# Patient Record
Sex: Female | Born: 1970 | Race: White | Hispanic: No | Marital: Married | Smoking: Never smoker
Health system: Southern US, Community
[De-identification: ages and names within clinical notes are randomized; demographics above are authoritative.]

## PROBLEM LIST (undated history)

## (undated) DIAGNOSIS — F419 Anxiety disorder, unspecified: Secondary | ICD-10-CM

## (undated) DIAGNOSIS — T8859XA Other complications of anesthesia, initial encounter: Secondary | ICD-10-CM

## (undated) DIAGNOSIS — I4891 Unspecified atrial fibrillation: Secondary | ICD-10-CM

## (undated) DIAGNOSIS — O4400 Placenta previa specified as without hemorrhage, unspecified trimester: Secondary | ICD-10-CM

## (undated) DIAGNOSIS — G4700 Insomnia, unspecified: Secondary | ICD-10-CM

## (undated) DIAGNOSIS — R519 Headache, unspecified: Secondary | ICD-10-CM

## (undated) DIAGNOSIS — F429 Obsessive-compulsive disorder, unspecified: Secondary | ICD-10-CM

## (undated) DIAGNOSIS — I471 Supraventricular tachycardia, unspecified: Secondary | ICD-10-CM

## (undated) DIAGNOSIS — K5909 Other constipation: Secondary | ICD-10-CM

## (undated) DIAGNOSIS — C801 Malignant (primary) neoplasm, unspecified: Secondary | ICD-10-CM

## (undated) DIAGNOSIS — E119 Type 2 diabetes mellitus without complications: Secondary | ICD-10-CM

## (undated) DIAGNOSIS — F32A Depression, unspecified: Secondary | ICD-10-CM

## (undated) DIAGNOSIS — F329 Major depressive disorder, single episode, unspecified: Secondary | ICD-10-CM

## (undated) HISTORY — PX: DIAGNOSTIC LAPAROSCOPY: SUR761

## (undated) HISTORY — DX: Unspecified atrial fibrillation: I48.91

## (undated) HISTORY — DX: Depression, unspecified: F32.A

## (undated) HISTORY — DX: Anxiety disorder, unspecified: F41.9

## (undated) HISTORY — PX: OTHER SURGICAL HISTORY: SHX169

## (undated) HISTORY — PX: APPENDECTOMY: SHX54

## (undated) HISTORY — PX: WISDOM TOOTH EXTRACTION: SHX21

## (undated) HISTORY — DX: Supraventricular tachycardia, unspecified: I47.10

## (undated) HISTORY — DX: Supraventricular tachycardia: I47.1

## (undated) HISTORY — DX: Major depressive disorder, single episode, unspecified: F32.9

---

## 2007-05-28 ENCOUNTER — Inpatient Hospital Stay (HOSPITAL_COMMUNITY): Admission: AD | Admit: 2007-05-28 | Discharge: 2007-05-29 | Payer: Self-pay | Admitting: Obstetrics & Gynecology

## 2007-06-15 ENCOUNTER — Inpatient Hospital Stay (HOSPITAL_COMMUNITY): Admission: AD | Admit: 2007-06-15 | Discharge: 2007-06-18 | Payer: Self-pay | Admitting: Obstetrics and Gynecology

## 2007-06-20 ENCOUNTER — Encounter: Admission: RE | Admit: 2007-06-20 | Discharge: 2007-07-20 | Payer: Self-pay | Admitting: *Deleted

## 2007-07-21 ENCOUNTER — Encounter: Admission: RE | Admit: 2007-07-21 | Discharge: 2007-08-19 | Payer: Self-pay | Admitting: *Deleted

## 2007-08-20 ENCOUNTER — Encounter: Admission: RE | Admit: 2007-08-20 | Discharge: 2007-09-11 | Payer: Self-pay | Admitting: *Deleted

## 2008-01-30 ENCOUNTER — Ambulatory Visit (HOSPITAL_COMMUNITY): Admission: RE | Admit: 2008-01-30 | Discharge: 2008-01-30 | Payer: Self-pay | Admitting: Internal Medicine

## 2009-03-01 ENCOUNTER — Encounter: Admission: RE | Admit: 2009-03-01 | Discharge: 2009-03-01 | Payer: Self-pay | Admitting: Diagnostic Radiology

## 2010-01-10 ENCOUNTER — Inpatient Hospital Stay (HOSPITAL_COMMUNITY): Admission: AD | Admit: 2010-01-10 | Discharge: 2010-01-10 | Payer: Self-pay | Admitting: Obstetrics and Gynecology

## 2010-01-27 ENCOUNTER — Observation Stay (HOSPITAL_COMMUNITY): Admission: AD | Admit: 2010-01-27 | Discharge: 2010-01-28 | Payer: Self-pay | Admitting: Obstetrics & Gynecology

## 2010-02-06 ENCOUNTER — Inpatient Hospital Stay (HOSPITAL_COMMUNITY): Admission: AD | Admit: 2010-02-06 | Discharge: 2010-02-08 | Payer: Self-pay | Admitting: Obstetrics and Gynecology

## 2010-11-09 LAB — CBC
MCHC: 33.5 g/dL (ref 30.0–36.0)
Platelets: 205 10*3/uL (ref 150–400)
RBC: 3.75 MIL/uL — ABNORMAL LOW (ref 3.87–5.11)
RDW: 14.3 % (ref 11.5–15.5)
WBC: 18 10*3/uL — ABNORMAL HIGH (ref 4.0–10.5)

## 2010-11-09 LAB — RPR: RPR Ser Ql: NONREACTIVE

## 2010-11-10 LAB — CBC
HCT: 31 % — ABNORMAL LOW (ref 36.0–46.0)
Hemoglobin: 10.3 g/dL — ABNORMAL LOW (ref 12.0–15.0)
WBC: 14.8 10*3/uL — ABNORMAL HIGH (ref 4.0–10.5)

## 2011-01-06 NOTE — H&P (Signed)
[name], [name]                   ACCOUNT NO.:  [id number]   MEDICAL RECORD NO.:  [id number]          PATIENT TYPE:  INP   LOCATION:  9106                          FACILITY:  [facility]   PHYSICIAN:  Lenoard Aden, M.D.DATE OF BIRTH:  27-Apr-1971   DATE OF ADMISSION:  06/15/2007  DATE OF DISCHARGE:                              HISTORY & PHYSICAL   CHIEF COMPLAINT:  Contractions.   She is a 40 year old white female, G3, P2, currently at 38-6/7th's  weeks' gestation with increased frequency of contractions this evening.  She has a history of a vaginal delivery of a 7 plus pound fetus  complicated by a 3rd degree laceration, followed by a primary C-section  for placenta previa.  She desires VBAC.  VBAC approved per notes by Dr.  Earlene Plater who has reviewed her previous C-section note.   MEDICATIONS:  Prenatal vitamins, Ambien as needed, Prozac.   FAMILY HISTORY:  Remarkable for rheumatoid arthritis, COPD, and alcohol  abuse.   She is a nonsmoker, nondrinker.  She denies domestic or physical  violence.   She has history as noted of first delivery in 2003, of a 7 pounds 11  ounces child and then a primary C-section for a 6 pounds 6 ounces  elective C-section at 37 weeks for a complete previa.   PRENATAL COURSE:  Records a placenta that is anterior and no previa is  noted.   PHYSICAL EXAMINATION:  GENERAL:  She is a well-developed, well-  nourished, white female in a moderate amount of distress.  HEENT:  Normal.  LUNGS:  Clear.  HEART:  Regular rhythm.  ABDOMEN:  Soft, gravid, nontender.  Estimated fetal weight on ultrasound  today is 6 pounds 11 ounces.  PELVIC:  Cervix is 3-to-4-cm, 80-90% effaced, vertex, zero station.  Artificial rupture of membranes clear.  IUPC placed.  EXTREMITIES:  Reveal no cords.  NEUROLOGIC:  Nonfocal.  SKIN:  Intact.   IMPRESSION:  1. A 38-6/7th's weeks intrauterine pregnancy.  2. Previous cesarean section with a history of successful vaginal  delivery for a trial of labor and vaginal birth after cesarean.   PLAN:  1. Attempt cautious attempts at vaginal delivery.  2. IUPC placed.  3. Pitocin augmentation as needed.  4. Small risk of uterine dehiscence and possible uterus rupture      approaching approximately 1-3% noted, the patient acknowledges and      wishes to proceed.      Lenoard Aden, M.D.  Electronically Signed     RJT/MEDQ  D:  06/15/2007  T:  06/16/2007  Job:  [id number]

## 2011-06-03 LAB — CBC
HCT: 29.4 — ABNORMAL LOW
Hemoglobin: 11.9 — ABNORMAL LOW
MCHC: 33.8
MCV: 87.4
MCV: 87.6
Platelets: 230
RBC: 4.03
RDW: 14.1 — ABNORMAL HIGH

## 2011-06-04 LAB — RUBELLA SCREEN: Rubella: 19.4 — ABNORMAL HIGH

## 2011-06-04 LAB — CBC
HCT: 33.1 — ABNORMAL LOW
Platelets: 241
RDW: 13.3

## 2011-08-25 HISTORY — PX: ABLATION: SHX5711

## 2011-10-15 ENCOUNTER — Encounter (HOSPITAL_COMMUNITY): Payer: Self-pay | Admitting: Pharmacist

## 2011-10-20 ENCOUNTER — Encounter (HOSPITAL_COMMUNITY)
Admission: RE | Admit: 2011-10-20 | Discharge: 2011-10-20 | Disposition: A | Discharge status: Home / Self Care | Payer: PRIVATE HEALTH INSURANCE | Source: Ambulatory Visit | Attending: Obstetrics and Gynecology | Admitting: Obstetrics and Gynecology

## 2011-10-20 ENCOUNTER — Encounter (HOSPITAL_COMMUNITY): Payer: Self-pay

## 2011-10-20 ENCOUNTER — Other Ambulatory Visit: Payer: Self-pay | Admitting: Obstetrics and Gynecology

## 2011-10-20 DIAGNOSIS — N8000 Endometriosis of the uterus, unspecified: Secondary | ICD-10-CM | POA: Insufficient documentation

## 2011-10-20 DIAGNOSIS — Z01812 Encounter for preprocedural laboratory examination: Secondary | ICD-10-CM | POA: Insufficient documentation

## 2011-10-20 HISTORY — DX: Other complications of anesthesia, initial encounter: T88.59XA

## 2011-10-20 HISTORY — DX: Obsessive-compulsive disorder, unspecified: F42.9

## 2011-10-20 HISTORY — DX: Insomnia, unspecified: G47.00

## 2011-10-20 LAB — SURGICAL PCR SCREEN: Staphylococcus aureus: POSITIVE — AB

## 2011-10-20 LAB — CBC
Hemoglobin: 12.8 g/dL (ref 12.0–15.0)
MCHC: 31.8 g/dL (ref 30.0–36.0)
RDW: 13.5 % (ref 11.5–15.5)
WBC: 6.8 10*3/uL (ref 4.0–10.5)

## 2011-10-20 NOTE — Patient Instructions (Addendum)
10/30/1318 [name]  10/20/2011   Your procedure is scheduled on:  10/30/11  Enter through the Main Entrance of [facility] at 945 AM.  Pick up the phone at the desk and dial [phone].   Call this number if you have problems the morning of surgery: [phone]   Remember:   Do not eat food:After Midnight.  Do not drink clear liquids: After Midnight.  Take these medicines the morning of surgery with A SIP OF WATER: NA   Do not wear jewelry, make-up or nail polish.  Do not wear lotions, powders, or perfumes. You may wear deodorant.  Do not shave 48 hours prior to surgery.  Do not bring valuables to the hospital.  Contacts, dentures or bridgework may not be worn into surgery.  Leave suitcase in the car. After surgery it may be brought to your room.  For patients admitted to the hospital, checkout time is 11:00 AM the day of discharge.   Patients discharged the day of surgery will not be allowed to drive home.  Name and phone number of your driver: NA  Special Instructions: CHG Shower Use Special Wash: 1/2 bottle night before surgery and 1/2 bottle morning of surgery.   Please read over the following fact sheets that you were given: MRSA Information

## 2011-10-29 NOTE — H&P (Signed)
[name], [name]                   ACCOUNT NO.:  [id number]  MEDICAL RECORD NO.:  [id number]  LOCATION:                                 FACILITY:  PHYSICIAN:  [name], M.D.DATE OF BIRTH:  July 04, 1971  DATE OF ADMISSION: DATE OF DISCHARGE:                             HISTORY & PHYSICAL   CHIEF COMPLAINT:  Dysmenorrhea and menorrhagia.  HISTORY OF PRESENT ILLNESS:  She is a 41 year old white female, G3, P3, husband status post vasectomy, with a history of C-section x1 and vaginal delivery x2, who presents now with worsening dysmenorrhea and menorrhagia.  The patient has a known history of a reportedly severe endometriosis status post laparoscopic resection in 2002.  She has a history of appendectomy in 1995.  She has medications to include Luvox.  She has medical problems to include OCP, dysmenorrhea, depression, migraine headaches.  She has a family history of rheumatoid arthritis, nicotine dependence, alcohol abuse, and COPD.  She has a history of vaginal delivery x2 and C-section x1 for placenta previa in addition to her previously noted surgical interventions.  PHYSICAL EXAMINATION:  GENERAL:  She is a well-developed, well- nourished, white female, in no acute distress. VITAL SIGNS:  Height is 65 inches, weight of 132 pounds. HEENT:  Normal. NECK:  Supple.  Full range of motion. LUNGS:  Clear. HEART:  Regular rhythm. ABDOMEN:  Soft, nontender. PELVIC:  Tenderness in the lower quadrants with an anteflexed uterus and no adnexal masses.  IMPRESSION: 1. Severe dysmenorrhea and menorrhagia. 2. History of pelvic endometriosis.  PLAN:  Proceed with diagnostic hysteroscopy, D and C, NovaSure endometrial ablation, diagnostic laparoscopy, possible robotic excision of endometriosis with possible lysis of adhesions. Risks of anesthesia and risks of surgery were discussed to include risks of anesthesia, infection, bleeding, injury to abdominal organs, need  for repair, delayed versus immediate complications to include bowel and bladder injury noted, possible inability to cure endometriosis were discussed, the patient acknowledges and we will proceed.     [name], M.D.     [name]/MEDQ  D:  10/29/2011  T:  10/29/2011  Job:  [id number]

## 2011-10-30 ENCOUNTER — Ambulatory Visit (HOSPITAL_COMMUNITY)
Admission: RE | Admit: 2011-10-30 | Discharge: 2011-10-30 | Disposition: A | Discharge status: Home Health | Payer: PRIVATE HEALTH INSURANCE | Source: Ambulatory Visit | Attending: Obstetrics and Gynecology | Admitting: Obstetrics and Gynecology

## 2011-10-30 ENCOUNTER — Encounter (HOSPITAL_COMMUNITY): Payer: Self-pay | Admitting: Anesthesiology

## 2011-10-30 ENCOUNTER — Encounter (HOSPITAL_COMMUNITY): Payer: Self-pay | Admitting: *Deleted

## 2011-10-30 ENCOUNTER — Encounter (HOSPITAL_COMMUNITY)
Admission: RE | Disposition: A | Discharge status: Home Health | Payer: Self-pay | Source: Ambulatory Visit | Attending: Obstetrics and Gynecology

## 2011-10-30 ENCOUNTER — Ambulatory Visit (HOSPITAL_COMMUNITY): Payer: PRIVATE HEALTH INSURANCE | Admitting: Anesthesiology

## 2011-10-30 DIAGNOSIS — Z01818 Encounter for other preprocedural examination: Secondary | ICD-10-CM | POA: Insufficient documentation

## 2011-10-30 DIAGNOSIS — N920 Excessive and frequent menstruation with regular cycle: Secondary | ICD-10-CM | POA: Insufficient documentation

## 2011-10-30 DIAGNOSIS — N946 Dysmenorrhea, unspecified: Secondary | ICD-10-CM

## 2011-10-30 DIAGNOSIS — N949 Unspecified condition associated with female genital organs and menstrual cycle: Secondary | ICD-10-CM | POA: Insufficient documentation

## 2011-10-30 DIAGNOSIS — Z01812 Encounter for preprocedural laboratory examination: Secondary | ICD-10-CM | POA: Insufficient documentation

## 2011-10-30 HISTORY — PX: LAPAROSCOPY: SHX197

## 2011-10-30 LAB — HCG, SERUM, QUALITATIVE: Preg, Serum: NEGATIVE

## 2011-10-30 SURGERY — LAPAROSCOPY, DIAGNOSTIC
Anesthesia: General | Wound class: Clean Contaminated

## 2011-10-30 MED ORDER — ROCURONIUM BROMIDE 100 MG/10ML IV SOLN
INTRAVENOUS | Status: DC | PRN
  Administered 2011-10-30: 50 mg via INTRAVENOUS

## 2011-10-30 MED ORDER — ONDANSETRON HCL 4 MG/2ML IJ SOLN
INTRAMUSCULAR | Status: DC | PRN
  Administered 2011-10-30: 4 mg via INTRAVENOUS

## 2011-10-30 MED ORDER — LIDOCAINE HCL (CARDIAC) 20 MG/ML IV SOLN
INTRAVENOUS | Status: DC | PRN
Start: 2011-10-30 — End: 2011-10-30
  Administered 2011-10-30: 50 mg via INTRAVENOUS

## 2011-10-30 MED ORDER — GLYCOPYRROLATE 0.2 MG/ML IJ SOLN
INTRAMUSCULAR | Status: AC
  Filled 2011-10-30: qty 2

## 2011-10-30 MED ORDER — MEPERIDINE HCL 25 MG/ML IJ SOLN
INTRAMUSCULAR | Status: AC
  Administered 2011-10-30: 25 mg via INTRAVENOUS
  Filled 2011-10-30: qty 1

## 2011-10-30 MED ORDER — MEPERIDINE HCL 25 MG/ML IJ SOLN
25.0000 mg | Freq: Once | INTRAMUSCULAR | Status: AC
  Administered 2011-10-30: 25 mg via INTRAVENOUS

## 2011-10-30 MED ORDER — MIDAZOLAM HCL 2 MG/2ML IJ SOLN
INTRAMUSCULAR | Status: AC
  Administered 2011-10-30: 1 mg via INTRAVENOUS
  Filled 2011-10-30: qty 2

## 2011-10-30 MED ORDER — NEOSTIGMINE METHYLSULFATE 1 MG/ML IJ SOLN
INTRAMUSCULAR | Status: AC
  Filled 2011-10-30: qty 10

## 2011-10-30 MED ORDER — LACTATED RINGERS IR SOLN
Status: DC | PRN
  Administered 2011-10-30: 3000 mL

## 2011-10-30 MED ORDER — CEFAZOLIN SODIUM 1-5 GM-% IV SOLN
INTRAVENOUS | Status: AC
  Filled 2011-10-30: qty 50

## 2011-10-30 MED ORDER — LORAZEPAM 2 MG/ML IJ SOLN
INTRAMUSCULAR | Status: AC
  Administered 2011-10-30: 1 mg
  Filled 2011-10-30: qty 1

## 2011-10-30 MED ORDER — MIDAZOLAM HCL 2 MG/2ML IJ SOLN
INTRAMUSCULAR | Status: AC
  Filled 2011-10-30: qty 2

## 2011-10-30 MED ORDER — ROCURONIUM BROMIDE 50 MG/5ML IV SOLN
INTRAVENOUS | Status: AC
  Filled 2011-10-30: qty 1

## 2011-10-30 MED ORDER — EPHEDRINE 5 MG/ML INJ
INTRAVENOUS | Status: AC
  Filled 2011-10-30: qty 10

## 2011-10-30 MED ORDER — ACETAMINOPHEN 325 MG PO TABS: 325.0000 mg | ORAL_TABLET | ORAL | Status: DC | PRN

## 2011-10-30 MED ORDER — VASOPRESSIN 20 UNIT/ML IJ SOLN
INTRAMUSCULAR | Status: AC
  Filled 2011-10-30: qty 1

## 2011-10-30 MED ORDER — SUFENTANIL CITRATE 50 MCG/ML IV SOLN
INTRAVENOUS | Status: DC | PRN
  Administered 2011-10-30 (×2): 20 ug via INTRAVENOUS
  Administered 2011-10-30: 10 ug via INTRAVENOUS

## 2011-10-30 MED ORDER — GLYCOPYRROLATE 0.2 MG/ML IJ SOLN
INTRAMUSCULAR | Status: DC | PRN
  Administered 2011-10-30: 0.4 mg via INTRAVENOUS

## 2011-10-30 MED ORDER — LIDOCAINE HCL (CARDIAC) 20 MG/ML IV SOLN
INTRAVENOUS | Status: AC
  Filled 2011-10-30: qty 5

## 2011-10-30 MED ORDER — ARTIFICIAL TEARS OP OINT
TOPICAL_OINTMENT | OPHTHALMIC | Status: DC | PRN
  Administered 2011-10-30: 1 via OPHTHALMIC

## 2011-10-30 MED ORDER — BUPIVACAINE HCL (PF) 0.25 % IJ SOLN
INTRAMUSCULAR | Status: AC
  Filled 2011-10-30: qty 30

## 2011-10-30 MED ORDER — ONDANSETRON HCL 4 MG/2ML IJ SOLN
INTRAMUSCULAR | Status: AC
  Filled 2011-10-30: qty 2

## 2011-10-30 MED ORDER — SUFENTANIL CITRATE 50 MCG/ML IV SOLN
INTRAVENOUS | Status: AC
  Filled 2011-10-30: qty 1

## 2011-10-30 MED ORDER — OXYCODONE-ACETAMINOPHEN 5-325 MG PO TABS: 1.0000 | ORAL_TABLET | ORAL | Status: AC | PRN

## 2011-10-30 MED ORDER — KETOROLAC TROMETHAMINE 30 MG/ML IJ SOLN
30.0000 mg | Freq: Once | INTRAMUSCULAR | Status: AC
  Administered 2011-10-30: 30 mg via INTRAVENOUS

## 2011-10-30 MED ORDER — KETOROLAC TROMETHAMINE 30 MG/ML IJ SOLN
INTRAMUSCULAR | Status: AC
  Filled 2011-10-30: qty 1

## 2011-10-30 MED ORDER — BUPIVACAINE HCL (PF) 0.25 % IJ SOLN
INTRAMUSCULAR | Status: DC | PRN
  Administered 2011-10-30: 30 mL

## 2011-10-30 MED ORDER — OXYCODONE-ACETAMINOPHEN 5-325 MG PO TABS
1.0000 | ORAL_TABLET | ORAL | Status: DC | PRN
  Administered 2011-10-30: 1 via ORAL

## 2011-10-30 MED ORDER — CEFAZOLIN SODIUM 1-5 GM-% IV SOLN
INTRAVENOUS | Status: DC | PRN
  Administered 2011-10-30: 1 g via INTRAVENOUS

## 2011-10-30 MED ORDER — KETOROLAC TROMETHAMINE 30 MG/ML IJ SOLN
INTRAMUSCULAR | Status: AC
  Administered 2011-10-30: 30 mg via INTRAVENOUS
  Filled 2011-10-30: qty 1

## 2011-10-30 MED ORDER — MIDAZOLAM HCL 5 MG/5ML IJ SOLN
INTRAMUSCULAR | Status: DC | PRN
  Administered 2011-10-30: 1 mg via INTRAVENOUS
  Administered 2011-10-30: 2 mg via INTRAVENOUS

## 2011-10-30 MED ORDER — EPHEDRINE SULFATE 50 MG/ML IJ SOLN
INTRAMUSCULAR | Status: DC | PRN
  Administered 2011-10-30 (×3): 5 mg via INTRAVENOUS

## 2011-10-30 MED ORDER — DEXAMETHASONE SODIUM PHOSPHATE 10 MG/ML IJ SOLN
INTRAMUSCULAR | Status: DC | PRN
  Administered 2011-10-30: 10 mg via INTRAVENOUS

## 2011-10-30 MED ORDER — DIPHENHYDRAMINE HCL 50 MG/ML IJ SOLN
INTRAMUSCULAR | Status: AC
  Administered 2011-10-30 (×2): 12.5 mg via INTRAVENOUS
  Filled 2011-10-30: qty 1

## 2011-10-30 MED ORDER — MIDAZOLAM HCL 2 MG/2ML IJ SOLN
1.0000 mg | INTRAMUSCULAR | Status: DC | PRN
  Administered 2011-10-30 (×2): 1 mg via INTRAVENOUS

## 2011-10-30 MED ORDER — LACTATED RINGERS IV SOLN
INTRAVENOUS | Status: DC
  Administered 2011-10-30 (×3): via INTRAVENOUS

## 2011-10-30 MED ORDER — NEOSTIGMINE METHYLSULFATE 1 MG/ML IJ SOLN
INTRAMUSCULAR | Status: DC | PRN
  Administered 2011-10-30: 2 mg via INTRAVENOUS

## 2011-10-30 MED ORDER — OXYCODONE-ACETAMINOPHEN 5-325 MG PO TABS
ORAL_TABLET | ORAL | Status: AC
  Administered 2011-10-30: 1 via ORAL
  Filled 2011-10-30: qty 1

## 2011-10-30 MED ORDER — KETOROLAC TROMETHAMINE 30 MG/ML IJ SOLN: 15.0000 mg | Freq: Once | INTRAMUSCULAR | Status: DC | PRN

## 2011-10-30 MED ORDER — PROPOFOL 10 MG/ML IV EMUL
INTRAVENOUS | Status: AC
  Filled 2011-10-30: qty 40

## 2011-10-30 MED ORDER — KETOROLAC TROMETHAMINE 60 MG/2ML IM SOLN
INTRAMUSCULAR | Status: DC | PRN
  Administered 2011-10-30: 30 mg via INTRAMUSCULAR

## 2011-10-30 MED ORDER — PROPOFOL 10 MG/ML IV EMUL
INTRAVENOUS | Status: DC | PRN
  Administered 2011-10-30: 200 mg via INTRAVENOUS

## 2011-10-30 MED ORDER — FENTANYL CITRATE 0.05 MG/ML IJ SOLN: 25.0000 ug | INTRAMUSCULAR | Status: DC | PRN

## 2011-10-30 MED ORDER — DEXAMETHASONE SODIUM PHOSPHATE 10 MG/ML IJ SOLN
INTRAMUSCULAR | Status: AC
  Filled 2011-10-30: qty 1

## 2011-10-30 MED ORDER — MIDAZOLAM HCL 2 MG/2ML IJ SOLN
2.0000 mg | Freq: Once | INTRAMUSCULAR | Status: AC
  Administered 2011-10-30: 2 mg via INTRAVENOUS

## 2011-10-30 SURGICAL SUPPLY — 75 items
ABLATOR ENDOMETRIAL BIPOLAR (ABLATOR) ×6 IMPLANT
BAG URINE DRAINAGE (UROLOGICAL SUPPLIES) IMPLANT
BARRIER ADHS 3X4 INTERCEED (GAUZE/BANDAGES/DRESSINGS) IMPLANT
CABLE HIGH FREQUENCY MONO STRZ (ELECTRODE) ×3 IMPLANT
CATH FOLEY 3WAY  5CC 16FR (CATHETERS) ×1
CATH FOLEY 3WAY 5CC 16FR (CATHETERS) ×2 IMPLANT
CATH ROBINSON RED A/P 16FR (CATHETERS) ×3 IMPLANT
CHLORAPREP W/TINT 26ML (MISCELLANEOUS) ×3 IMPLANT
CLOTH BEACON ORANGE TIMEOUT ST (SAFETY) ×3 IMPLANT
CONT PATH 16OZ SNAP LID 3702 (MISCELLANEOUS) ×3 IMPLANT
CONTAINER PREFILL 10% NBF 60ML (FORM) ×6 IMPLANT
COVER MAYO STAND STRL (DRAPES) ×3 IMPLANT
COVER TABLE BACK 60X90 (DRAPES) ×6 IMPLANT
COVER TIP SHEARS 8 DVNC (MISCELLANEOUS) ×2 IMPLANT
COVER TIP SHEARS 8MM DA VINCI (MISCELLANEOUS) ×1
DECANTER SPIKE VIAL GLASS SM (MISCELLANEOUS) ×3 IMPLANT
DERMABOND ADVANCED (GAUZE/BANDAGES/DRESSINGS) ×1
DERMABOND ADVANCED .7 DNX12 (GAUZE/BANDAGES/DRESSINGS) ×2 IMPLANT
DRAPE HUG U DISPOSABLE (DRAPE) ×3 IMPLANT
DRAPE LG THREE QUARTER DISP (DRAPES) ×6 IMPLANT
DRAPE MONITOR DA VINCI (DRAPE) IMPLANT
DRAPE WARM FLUID 44X44 (DRAPE) ×3 IMPLANT
ELECT REM PT RETURN 9FT ADLT (ELECTROSURGICAL) ×3
ELECTRODE REM PT RTRN 9FT ADLT (ELECTROSURGICAL) ×2 IMPLANT
EVACUATOR SMOKE 8.L (FILTER) ×3 IMPLANT
GAUZE VASELINE 3X9 (GAUZE/BANDAGES/DRESSINGS) IMPLANT
GLOVE BIO SURGEON STRL SZ7.5 (GLOVE) ×9 IMPLANT
GOWN PREVENTION PLUS LG XLONG (DISPOSABLE) ×3 IMPLANT
GOWN PREVENTION PLUS XLARGE (GOWN DISPOSABLE) ×3 IMPLANT
GOWN STRL REIN XL XLG (GOWN DISPOSABLE) ×18 IMPLANT
GYRUS RUMI II 2.5CM BLUE (DISPOSABLE)
GYRUS RUMI II 3.5CM BLUE (DISPOSABLE)
GYRUS RUMI II 4.0CM BLUE (DISPOSABLE)
KIT ACCESSORY DA VINCI DISP (KITS) ×1
KIT ACCESSORY DVNC DISP (KITS) ×2 IMPLANT
KIT DISP ACCESSORY 4 ARM (KITS) IMPLANT
MANIPULATOR UTERINE 4.5 ZUMI (MISCELLANEOUS) ×3 IMPLANT
NEEDLE INSUFFLATION 14GA 120MM (NEEDLE) ×3 IMPLANT
PACK HYSTEROSCOPY LF (CUSTOM PROCEDURE TRAY) ×3 IMPLANT
PACK LAVH (CUSTOM PROCEDURE TRAY) ×3 IMPLANT
PAD PREP 24X48 CUFFED NSTRL (MISCELLANEOUS) ×6 IMPLANT
PLUG CATH AND CAP STER (CATHETERS) IMPLANT
POSITIONER SURGICAL ARM (MISCELLANEOUS) ×6 IMPLANT
PROTECTOR NERVE ULNAR (MISCELLANEOUS) ×6 IMPLANT
RUMI II 3.0CM BLUE KOH-EFFICIE (DISPOSABLE) IMPLANT
RUMI II GYRUS 2.5CM BLUE (DISPOSABLE) IMPLANT
RUMI II GYRUS 3.5CM BLUE (DISPOSABLE) IMPLANT
RUMI II GYRUS 4.0CM BLUE (DISPOSABLE) IMPLANT
SET IRRIG TUBING LAPAROSCOPIC (IRRIGATION / IRRIGATOR) ×3 IMPLANT
SOLUTION ELECTROLUBE (MISCELLANEOUS) ×3 IMPLANT
SPONGE LAP 18X18 X RAY DECT (DISPOSABLE) IMPLANT
SUT VIC AB 0 CT1 27 (SUTURE) ×2
SUT VIC AB 0 CT1 27XBRD ANBCTR (SUTURE) ×4 IMPLANT
SUT VIC AB 0 CT1 27XBRD ANTBC (SUTURE) IMPLANT
SUT VICRYL 0 UR6 27IN ABS (SUTURE) ×6 IMPLANT
SUT VICRYL RAPIDE 4/0 PS 2 (SUTURE) ×6 IMPLANT
SYR 50ML LL SCALE MARK (SYRINGE) ×3 IMPLANT
SYR TB 1ML 25GX5/8 (SYRINGE) ×3 IMPLANT
SYRINGE 10CC LL (SYRINGE) ×3 IMPLANT
SYSTEM CONVERTIBLE TROCAR (TROCAR) IMPLANT
TIP UTERINE 5.1X6CM LAV DISP (MISCELLANEOUS) IMPLANT
TIP UTERINE 6.7X10CM GRN DISP (MISCELLANEOUS) IMPLANT
TIP UTERINE 6.7X6CM WHT DISP (MISCELLANEOUS) IMPLANT
TIP UTERINE 6.7X8CM BLUE DISP (MISCELLANEOUS) IMPLANT
TOWEL OR 17X24 6PK STRL BLUE (TOWEL DISPOSABLE) ×9 IMPLANT
TRAY FOLEY CATH 16FR SILVER (SET/KITS/TRAYS/PACK) ×3 IMPLANT
TROCAR DISP BLADELESS 8 DVNC (TROCAR) ×2 IMPLANT
TROCAR DISP BLADELESS 8MM (TROCAR) ×1
TROCAR XCEL 12X100 BLDLESS (ENDOMECHANICALS) IMPLANT
TROCAR XCEL NON-BLD 5MMX100MML (ENDOMECHANICALS) ×3 IMPLANT
TROCAR Z-THREAD 12X150 (TROCAR) ×3 IMPLANT
TROCAR Z-THREAD FIOS 12X100MM (TROCAR) IMPLANT
TUBING FILTER THERMOFLATOR (ELECTROSURGICAL) ×3 IMPLANT
WARMER LAPAROSCOPE (MISCELLANEOUS) ×3 IMPLANT
WATER STERILE IRR 1000ML POUR (IV SOLUTION) ×9 IMPLANT

## 2011-10-30 NOTE — Progress Notes (Signed)
Patient ID: [name], female   DOB: June 01, 1971, 41 y.o.   MRN: [id number] Patient seen and examined. Consent witnessed and signed. No changes noted. Update completed.

## 2011-10-30 NOTE — Discharge Instructions (Signed)

## 2011-10-30 NOTE — Anesthesia Preprocedure Evaluation (Signed)
Anesthesia Evaluation  Patient identified by MRN, date of birth, ID band Patient awake    Reviewed: Allergy & Precautions, H&P , Patient's Chart, lab work & pertinent test results, reviewed documented beta blocker date and time   Airway Mallampati: II TM Distance: >3 FB Neck ROM: full    Dental No notable dental hx.    Pulmonary neg pulmonary ROS,  breath sounds clear to auscultation  Pulmonary exam normal       Cardiovascular Exercise Tolerance: Good negative cardio ROS  Rhythm:regular Rate:Normal     Neuro/Psych negative neurological ROS  negative psych ROS   GI/Hepatic negative GI ROS, Neg liver ROS,   Endo/Other  negative endocrine ROS  Renal/GU negative Renal ROS     Musculoskeletal   Abdominal   Peds  Hematology negative hematology ROS (+)   Anesthesia Other Findings   Reproductive/Obstetrics negative OB ROS                           Anesthesia Physical Anesthesia Plan  ASA: II  Anesthesia Plan: General ETT   Post-op Pain Management:    Induction:   Airway Management Planned:   Additional Equipment:   Intra-op Plan:   Post-operative Plan:   Informed Consent: I have reviewed the patients History and Physical, chart, labs and discussed the procedure including the risks, benefits and alternatives for the proposed anesthesia with the patient or authorized representative who has indicated his/her understanding and acceptance.   Dental Advisory Given  Plan Discussed with: CRNA and Surgeon  Anesthesia Plan Comments:         Anesthesia Quick Evaluation

## 2011-10-30 NOTE — Anesthesia Postprocedure Evaluation (Signed)
Anesthesia Post Note  Patient: [name]  Procedure(s) Performed: Procedure(s) (LRB): LAPAROSCOPY DIAGNOSTIC (N/A) DILATATION & CURETTAGE/HYSTEROSCOPY WITH NOVASURE ABLATION ()  Anesthesia type: GA  Patient location: PACU  Post pain: Pain level controlled  Post assessment: Post-op Vital signs reviewed  Last Vitals:  Filed Vitals:   10/30/11 1215  BP:   Pulse:   Temp: 36.6 C  Resp:     Post vital signs: Reviewed  Level of consciousness: sedated  Complications: No apparent anesthesia complications

## 2011-10-30 NOTE — Transfer of Care (Signed)
Immediate Anesthesia Transfer of Care Note  Patient: [name]  Procedure(s) Performed: Procedure(s) (LRB): LAPAROSCOPY DIAGNOSTIC (N/A) DILATATION & CURETTAGE/HYSTEROSCOPY WITH NOVASURE ABLATION ()  Patient Location: PACU  Anesthesia Type: General  Level of Consciousness: oriented and sedated  Airway & Oxygen Therapy: Patient Spontanous Breathing and Patient connected to nasal cannula oxygen  Post-op Assessment: Report given to PACU RN and Post -op Vital signs reviewed and stable  Post vital signs: stable  Complications: No apparent anesthesia complications

## 2011-10-30 NOTE — Progress Notes (Signed)
Pt received in recovery at 12:12. Pt  Heart rate 140's to 150' sinus tachycardia janet mullins at bedside(crna )calls dr Brayton Caves to report no further orders given pt assymptomatic.  12:15 pt co itching janet mullins crna gets order to give iv benedryl.  Pt gets relief from itching continues to have elevated heart rate. 12:40 pt starts shiverring orders given to give demerol iv for shiverring. shiverring starts to get better . shiverring starts again. Demerol given aagain at 12:51.  Pt heart rate continues to be elevated thorougout pacu stay at time of discharge heartrate 120's pt assymptomatic . Dr Cristela Blue gives orders to let pt get up to go home.  approx 15:15 pt unable to void states she feels urge to void. Call to dr Cristela Blue toin and out cath pt . In and out cath voided 700cc clear yellow urine , bladder scanned prior to cathing. Scanned for 370 cc. Pt states relief after cathing. Discharged to home. Pt heartrate 112-118 at time of discharge.

## 2011-10-30 NOTE — Op Note (Signed)
10/30/2011  11:56 AM  PATIENT:  [name]  41 y.o. female  PRE-OPERATIVE DIAGNOSIS:  History of Severe Endometriosis; Pelvic Pain; Menorrhagia  POST-OPERATIVE DIAGNOSIS:  History of Severe Endometriosis; Pelvic Pain; Menorrhagia  PROCEDURE:  Procedure(s): LAPAROSCOPY DIAGNOSTIC DILATATION & CURETTAGE/HYSTEROSCOPY WITH NOVASURE ABLATION  SURGEON:  Surgeon(s): Lenoard Aden, MD  ASSISTANTS: none   ANESTHESIA:   local and general  ESTIMATED BLOOD LOSS: * No blood loss amount entered *   DRAINS: none   LOCAL MEDICATIONS USED:  MARCAINE     SPECIMEN:  Source of Specimen:  [facility]  DISPOSITION OF SPECIMEN:  PATHOLOGY  COUNTS:  YES  DICTATION #: [id number]  PLAN OF CARE: DC home  PATIENT DISPOSITION:  PACU - hemodynamically stable.

## 2011-10-31 NOTE — Op Note (Signed)
NAME:  [name], [name]                   ACCOUNT NO.:  [id number]  MEDICAL RECORD NO.:  [id number]  LOCATION:  [facility]                          FACILITY:  [facility]  PHYSICIAN:  Lenoard Aden, M.D.DATE OF BIRTH:  03-30-1971  DATE OF PROCEDURE: DATE OF DISCHARGE:  10/30/2011                              OPERATIVE REPORT   DESCRIPTION OF PROCEDURE:  After being apprised of risks of anesthesia, infection, bleeding, injury to abdominal organs, possible need for repair, delayed versus immediate complications to include bowel and bladder injury, possible need for repair, inability to cure pelvic pain, the patient was brought to the operating room where she was administered general anesthetic without complications. She was prepped and draped in usual sterile fashion.  Foley catheter placed.  ZUMI retractor placed per vagina.  Attention was turned to the infraumbilical portion of the procedure whereby incision was made with a scalpel.  Veress needle placed, an opening pressure of -1 was noted, 4 L of CO2 insufflated without difficulty.  A trocar placed atraumatically.  Visualization reveals a normal uterus, normal anterior-posterior cul-de-sac, small amount of scarring anteriorly from previous C section.  Both ovarian fossas appear normal.  Both tubes appear normal.  Both ovaries appear normal.  Posterior cul-de-sac appears normal.  Appendix was visualized and appears to be normal as well.  No evidence of pelvic endometriosis is noted.  Therefore, the procedure was terminated.  The CO2 was released.  The trocar and scope were removed.  Atraumatic trocar entry was assured upon visualization initially in the procedure.  Upon closure after release of CO2, the incisions closed with 0 Vicryl, 4-0 Vicryl and Dermabond.  Attention was turned to the pelvic portion of the procedure whereby the cervix was easily dilated up to a 25 Pratt dilator. Hysteroscope placed.  Visualization revealed a normal  endometrial cavity.  Endometrial curettings were collected in sharp curettage and 4 quadrant method.  Revisualization revealed an otherwise normal cavity. The NovaSure device was seated in place to a length of 6, a width of 4.5, for a 149 wattage for 63 seconds.  Please note that the CO2 test was performed and was negative.  At the end of the NovaSure procedure, the device was removed, inspected, and found to be intact. Revisualization of the endometrial cavity revealed a normal endometrial cavity, and a well ablated endometrial cavity.  Good hemostasis was noted.  Fluid deficit was minimal.  The patient tolerated the procedure well, was awakened and transferred to recovery in good condition.     Lenoard Aden, M.D.     RJT/MEDQ  D:  10/30/2011  T:  10/31/2011  Job:  [id number]

## 2011-11-02 ENCOUNTER — Encounter (HOSPITAL_COMMUNITY): Payer: Self-pay | Admitting: Obstetrics and Gynecology

## 2012-01-11 ENCOUNTER — Other Ambulatory Visit: Payer: Self-pay | Admitting: Dermatology

## 2012-06-03 ENCOUNTER — Other Ambulatory Visit: Payer: Self-pay | Admitting: Obstetrics and Gynecology

## 2012-06-03 DIAGNOSIS — N644 Mastodynia: Secondary | ICD-10-CM

## 2012-06-08 ENCOUNTER — Other Ambulatory Visit: Payer: Self-pay | Admitting: Obstetrics and Gynecology

## 2012-06-08 ENCOUNTER — Ambulatory Visit
Admission: RE | Admit: 2012-06-08 | Discharge: 2012-06-08 | Disposition: A | Discharge status: Home / Self Care | Payer: PRIVATE HEALTH INSURANCE | Source: Ambulatory Visit | Attending: Obstetrics and Gynecology | Admitting: Obstetrics and Gynecology

## 2012-06-08 DIAGNOSIS — N644 Mastodynia: Secondary | ICD-10-CM

## 2013-02-10 DIAGNOSIS — R251 Tremor, unspecified: Secondary | ICD-10-CM | POA: Insufficient documentation

## 2013-02-10 DIAGNOSIS — R5381 Other malaise: Secondary | ICD-10-CM | POA: Insufficient documentation

## 2013-02-17 ENCOUNTER — Ambulatory Visit (INDEPENDENT_AMBULATORY_CARE_PROVIDER_SITE_OTHER): Payer: PRIVATE HEALTH INSURANCE | Admitting: Emergency Medicine

## 2013-02-17 VITALS — BP 90/64 | HR 87 | Temp 97.9°F | Resp 18 | Ht 65.5 in | Wt 132.0 lb

## 2013-02-17 DIAGNOSIS — L0390 Cellulitis, unspecified: Secondary | ICD-10-CM

## 2013-02-17 DIAGNOSIS — L0291 Cutaneous abscess, unspecified: Secondary | ICD-10-CM

## 2013-02-17 DIAGNOSIS — L309 Dermatitis, unspecified: Secondary | ICD-10-CM

## 2013-02-17 DIAGNOSIS — L259 Unspecified contact dermatitis, unspecified cause: Secondary | ICD-10-CM

## 2013-02-17 MED ORDER — SULFAMETHOXAZOLE-TRIMETHOPRIM 800-160 MG PO TABS: 1.0000 | ORAL_TABLET | Freq: Two times a day (BID) | ORAL | Status: DC

## 2013-02-17 MED ORDER — CYPROHEPTADINE HCL 4 MG PO TABS: 4.0000 mg | ORAL_TABLET | Freq: Four times a day (QID) | ORAL | Status: DC | PRN

## 2013-02-17 MED ORDER — METHYLPREDNISOLONE ACETATE 80 MG/ML IJ SUSP
120.0000 mg | Freq: Once | INTRAMUSCULAR | Status: AC
  Administered 2013-02-17: 120 mg via INTRAMUSCULAR

## 2013-02-17 NOTE — Progress Notes (Signed)
[facility] [address], [place] [address] [phone]  Date:  02/17/2013   Name:  [name]   DOB:  11-02-1970   MRN:  [id number]  PCP:  Lenoard Aden, MD    Chief Complaint: reaction to laser hair removal   History of Present Illness:  [name] is a 42 y.o. very pleasant female patient who presents with the following:  Completed a course of laser hair removal and now has broken out on her legs and axilla and less in her bikini line.  No fever or chills.  No improvement with over the counter medications or other home remedies. Denies other complaint or health concern today.   There are no active problems to display for this patient.   Past Medical History  Diagnosis Date  . Complication of anesthesia     states she extubated herself in OR when waking from anesthersia  . OCD (obsessive compulsive disorder)   . Insomnia   . Anxiety   . Depression     Past Surgical History  Procedure Laterality Date  . Diagnostic laparoscopy    . Cesarean section  2007  . Appendectomy    . Laparoscopy  10/30/2011    Procedure: LAPAROSCOPY DIAGNOSTIC;  Surgeon: Lenoard Aden, MD;  Location: [facility];  Service: Gynecology;  Laterality: N/A;    History  Substance Use Topics  . Smoking status: Never Smoker   . Smokeless tobacco: Not on file  . Alcohol Use: Yes     Comment: occasionally    Family History  Problem Relation Age of Onset  . COPD Mother     No Known Allergies  Medication list has been reviewed and updated.  Current Outpatient Prescriptions on File Prior to Visit  Medication Sig Dispense Refill  . fish oil-omega-3 fatty acids 1000 MG capsule Take 2 g by mouth daily. Uses [employer]atur[employer] brand.      . fluvoxaMINE (LUVOX) 100 MG tablet Take 300 mg by mouth at bedtime.      Marland Kitchen ibuprofen (ADVIL,MOTRIN) 200 MG tablet Take 800 mg by mouth every 8 (eight) hours as needed. For Headaches.      . Multiple Vitamin (MULITIVITAMIN WITH MINERALS)  TABS Take 1 tablet by mouth daily. Uses [employer]atur[employer] brand.      . QUEtiapine (SEROQUEL) 25 MG tablet Take 25 mg by mouth at bedtime as needed. Anxiety/depression.       No current facility-administered medications on file prior to visit.    Review of Systems:  As per HPI, otherwise negative.    Physical Examination: Filed Vitals:   02/17/13 1530  BP: 90/64  Pulse: 87  Temp: 97.9 F (36.6 C)  Resp: 18   Filed Vitals:   02/17/13 1530  Height: 5' 5.5" (1.664 m)  Weight: 132 lb (59.875 kg)   Body mass index is 21.62 kg/(m^2). Ideal Body Weight: Weight in (lb) to have BMI = 25: 152.2   GEN: WDWN, NAD, Non-toxic, Alert & Oriented x 3 HEENT: Atraumatic, Normocephalic.  Ears and Nose: No external deformity. EXTR: No clubbing/cyanosis/edema NEURO: Normal gait.  PSYCH: Normally interactive. Conversant. Not depressed or anxious appearing.  Calm demeanor.  SKIN:  Extensive erythematous maculo-papular eruption on both legs and axillae.  Some excoriated and draining.  Assessment and Plan: Cellulitis Reaction to laser treatment Continue benadryl Periactin Depo medrol  Signed,  Phillips Odor, MD

## 2013-05-23 ENCOUNTER — Other Ambulatory Visit: Payer: Self-pay

## 2013-05-23 DIAGNOSIS — Z1231 Encounter for screening mammogram for malignant neoplasm of breast: Secondary | ICD-10-CM

## 2013-06-12 ENCOUNTER — Ambulatory Visit
Admission: RE | Admit: 2013-06-12 | Discharge: 2013-06-12 | Disposition: A | Discharge status: Home / Self Care | Payer: PRIVATE HEALTH INSURANCE | Source: Ambulatory Visit

## 2013-06-12 DIAGNOSIS — Z1231 Encounter for screening mammogram for malignant neoplasm of breast: Secondary | ICD-10-CM

## 2013-06-13 ENCOUNTER — Ambulatory Visit: Payer: PRIVATE HEALTH INSURANCE

## 2014-02-03 ENCOUNTER — Observation Stay (HOSPITAL_COMMUNITY)
Admission: EM | Admit: 2014-02-03 | Discharge: 2014-02-04 | Disposition: A | Discharge status: Home / Self Care | Payer: PRIVATE HEALTH INSURANCE | Attending: Cardiology | Admitting: Cardiology

## 2014-02-03 ENCOUNTER — Encounter (HOSPITAL_COMMUNITY): Payer: Self-pay | Admitting: Emergency Medicine

## 2014-02-03 ENCOUNTER — Emergency Department (HOSPITAL_COMMUNITY): Payer: PRIVATE HEALTH INSURANCE

## 2014-02-03 ENCOUNTER — Observation Stay (HOSPITAL_COMMUNITY): Payer: PRIVATE HEALTH INSURANCE

## 2014-02-03 DIAGNOSIS — F419 Anxiety disorder, unspecified: Secondary | ICD-10-CM | POA: Diagnosis present

## 2014-02-03 DIAGNOSIS — F329 Major depressive disorder, single episode, unspecified: Secondary | ICD-10-CM | POA: Diagnosis present

## 2014-02-03 DIAGNOSIS — I4891 Unspecified atrial fibrillation: Secondary | ICD-10-CM

## 2014-02-03 DIAGNOSIS — R079 Chest pain, unspecified: Secondary | ICD-10-CM

## 2014-02-03 DIAGNOSIS — F32A Depression, unspecified: Secondary | ICD-10-CM | POA: Diagnosis present

## 2014-02-03 DIAGNOSIS — R000 Tachycardia, unspecified: Secondary | ICD-10-CM | POA: Insufficient documentation

## 2014-02-03 DIAGNOSIS — I480 Paroxysmal atrial fibrillation: Secondary | ICD-10-CM | POA: Diagnosis present

## 2014-02-03 DIAGNOSIS — F429 Obsessive-compulsive disorder, unspecified: Secondary | ICD-10-CM | POA: Diagnosis present

## 2014-02-03 DIAGNOSIS — R42 Dizziness and giddiness: Secondary | ICD-10-CM | POA: Insufficient documentation

## 2014-02-03 DIAGNOSIS — R002 Palpitations: Secondary | ICD-10-CM | POA: Insufficient documentation

## 2014-02-03 DIAGNOSIS — G4700 Insomnia, unspecified: Secondary | ICD-10-CM | POA: Insufficient documentation

## 2014-02-03 DIAGNOSIS — F411 Generalized anxiety disorder: Secondary | ICD-10-CM | POA: Insufficient documentation

## 2014-02-03 DIAGNOSIS — Z79899 Other long term (current) drug therapy: Secondary | ICD-10-CM | POA: Insufficient documentation

## 2014-02-03 DIAGNOSIS — F3289 Other specified depressive episodes: Secondary | ICD-10-CM | POA: Insufficient documentation

## 2014-02-03 HISTORY — DX: Unspecified atrial fibrillation: I48.91

## 2014-02-03 LAB — I-STAT CHEM 8, ED
BUN: 19 mg/dL (ref 6–23)
Calcium, Ion: 1.18 mmol/L (ref 1.12–1.23)
Chloride: 104 meq/L (ref 96–112)
Creatinine, Ser: 1 mg/dL (ref 0.50–1.10)
Glucose, Bld: 95 mg/dL (ref 70–99)
HCT: 43 % (ref 36.0–46.0)
Hemoglobin: 14.6 g/dL (ref 12.0–15.0)
Potassium: 3.5 meq/L — ABNORMAL LOW (ref 3.7–5.3)
Sodium: 142 meq/L (ref 137–147)
TCO2: 22 mmol/L (ref 0–100)

## 2014-02-03 LAB — D-DIMER, QUANTITATIVE (NOT AT ARMC): D-Dimer, Quant: 4.3 ug/mL-FEU — ABNORMAL HIGH (ref 0.00–0.48)

## 2014-02-03 LAB — CBC WITH DIFFERENTIAL/PLATELET
BASOS ABS: 0 10*3/uL (ref 0.0–0.1)
Basophils Relative: 0 % (ref 0–1)
Eosinophils Absolute: 0.3 10*3/uL (ref 0.0–0.7)
Eosinophils Relative: 3 % (ref 0–5)
HEMATOCRIT: 40.1 % (ref 36.0–46.0)
HEMOGLOBIN: 13.5 g/dL (ref 12.0–15.0)
LYMPHS ABS: 3.3 10*3/uL (ref 0.7–4.0)
Lymphocytes Relative: 39 % (ref 12–46)
MCH: 29.8 pg (ref 26.0–34.0)
MCHC: 33.7 g/dL (ref 30.0–36.0)
MCV: 88.5 fL (ref 78.0–100.0)
MONO ABS: 0.8 10*3/uL (ref 0.1–1.0)
MONOS PCT: 9 % (ref 3–12)
NEUTROS ABS: 4.1 10*3/uL (ref 1.7–7.7)
Neutrophils Relative %: 49 % (ref 43–77)
Platelets: 212 10*3/uL (ref 150–400)
RBC: 4.53 MIL/uL (ref 3.87–5.11)
RDW: 12.7 % (ref 11.5–15.5)
WBC: 8.5 10*3/uL (ref 4.0–10.5)

## 2014-02-03 LAB — I-STAT TROPONIN, ED: Troponin i, poc: 0.01 ng/mL (ref 0.00–0.08)

## 2014-02-03 LAB — COMPREHENSIVE METABOLIC PANEL WITH GFR
ALT: 9 U/L (ref 0–35)
AST: 14 U/L (ref 0–37)
Albumin: 4 g/dL (ref 3.5–5.2)
Alkaline Phosphatase: 42 U/L (ref 39–117)
BUN: 18 mg/dL (ref 6–23)
CO2: 22 meq/L (ref 19–32)
Calcium: 9.5 mg/dL (ref 8.4–10.5)
Chloride: 102 meq/L (ref 96–112)
Creatinine, Ser: 0.94 mg/dL (ref 0.50–1.10)
GFR calc Af Amer: 86 mL/min — ABNORMAL LOW
GFR calc non Af Amer: 74 mL/min — ABNORMAL LOW
Glucose, Bld: 97 mg/dL (ref 70–99)
Potassium: 3.5 meq/L — ABNORMAL LOW (ref 3.7–5.3)
Sodium: 139 meq/L (ref 137–147)
Total Bilirubin: 0.4 mg/dL (ref 0.3–1.2)
Total Protein: 7 g/dL (ref 6.0–8.3)

## 2014-02-03 LAB — POC URINE PREG, ED: PREG TEST UR: NEGATIVE

## 2014-02-03 MED ORDER — ADENOSINE 6 MG/2ML IV SOLN
12.0000 mg | Freq: Once | INTRAVENOUS | Status: AC
  Administered 2014-02-03: 12 mg via INTRAVENOUS

## 2014-02-03 MED ORDER — QUETIAPINE FUMARATE 25 MG PO TABS
25.0000 mg | ORAL_TABLET | Freq: Every evening | ORAL | Status: DC | PRN
  Administered 2014-02-04: 25 mg via ORAL
  Filled 2014-02-03 (×3): qty 1

## 2014-02-03 MED ORDER — DILTIAZEM HCL 25 MG/5ML IV SOLN
20.0000 mg | Freq: Once | INTRAVENOUS | Status: AC
  Administered 2014-02-03: 20 mg via INTRAVENOUS
  Filled 2014-02-03: qty 5

## 2014-02-03 MED ORDER — ADENOSINE 6 MG/2ML IV SOLN
INTRAVENOUS | Status: AC
  Filled 2014-02-03: qty 4

## 2014-02-03 MED ORDER — ADENOSINE 6 MG/2ML IV SOLN
6.0000 mg | Freq: Once | INTRAVENOUS | Status: AC
  Administered 2014-02-03: 6 mg via INTRAVENOUS

## 2014-02-03 MED ORDER — DILTIAZEM HCL 100 MG IV SOLR: 5.0000 mg/h | INTRAVENOUS | Status: DC

## 2014-02-03 MED ORDER — DILTIAZEM HCL 100 MG IV SOLR: 5.0000 mg/h | Freq: Once | INTRAVENOUS | Status: DC

## 2014-02-03 MED ORDER — SODIUM CHLORIDE 0.9 % IV BOLUS (SEPSIS)
1000.0000 mL | Freq: Once | INTRAVENOUS | Status: AC
  Administered 2014-02-03: 1000 mL via INTRAVENOUS

## 2014-02-03 MED ORDER — ADENOSINE 6 MG/2ML IV SOLN
INTRAVENOUS | Status: AC
  Filled 2014-02-03: qty 6

## 2014-02-03 MED ORDER — VORTIOXETINE HBR 10 MG PO TABS
10.0000 mg | ORAL_TABLET | Freq: Every day | ORAL | Status: DC
  Administered 2014-02-04: 10 mg via ORAL
  Filled 2014-02-03: qty 1

## 2014-02-03 MED ORDER — ONDANSETRON HCL 4 MG/2ML IJ SOLN
4.0000 mg | Freq: Once | INTRAMUSCULAR | Status: AC
  Administered 2014-02-03: 4 mg via INTRAVENOUS
  Filled 2014-02-03: qty 2

## 2014-02-03 MED ORDER — POTASSIUM CHLORIDE CRYS ER 20 MEQ PO TBCR
40.0000 meq | EXTENDED_RELEASE_TABLET | Freq: Once | ORAL | Status: AC
  Administered 2014-02-03: 40 meq via ORAL
  Filled 2014-02-03: qty 2

## 2014-02-03 MED ORDER — BUPROPION HCL 100 MG PO TABS
100.0000 mg | ORAL_TABLET | Freq: Every day | ORAL | Status: DC
  Administered 2014-02-04: 100 mg via ORAL
  Filled 2014-02-03 (×2): qty 1

## 2014-02-03 MED ORDER — IOHEXOL 350 MG/ML SOLN
100.0000 mL | Freq: Once | INTRAVENOUS | Status: AC | PRN
  Administered 2014-02-03: 100 mL via INTRAVENOUS

## 2014-02-03 MED ORDER — HEPARIN BOLUS VIA INFUSION
3000.0000 [IU] | Freq: Once | INTRAVENOUS | Status: AC
  Administered 2014-02-03: 3000 [IU] via INTRAVENOUS
  Filled 2014-02-03: qty 3000

## 2014-02-03 MED ORDER — HEPARIN (PORCINE) IN NACL 100-0.45 UNIT/ML-% IJ SOLN
1000.0000 [IU]/h | INTRAMUSCULAR | Status: DC
  Administered 2014-02-03: 1000 [IU]/h via INTRAVENOUS
  Filled 2014-02-03: qty 250

## 2014-02-03 NOTE — ED Provider Notes (Signed)
CSN: [id number]     Arrival date & time 02/03/14  1905 History   First MD Initiated Contact with Patient 02/03/14 1928     Chief Complaint  Patient presents with  . Chest Pain     (Consider location/radiation/quality/duration/timing/severity/associated sxs/prior Treatment) The history is provided by the patient.  [name] is a 43 y.o. female hx of depression here with palpitations. She was at a party with friends. Then felt dizzy and lightheaded and palpitations suddenly. Almost passed out in the kitchen and had some chest pains. Denies history of afib or arrhythmias. Father died in his 71s with unknown cause. Denies CAD.     Past Medical History  Diagnosis Date  . Complication of anesthesia     states she extubated herself in OR when waking from anesthersia  . OCD (obsessive compulsive disorder)   . Insomnia   . Anxiety   . Depression    Past Surgical History  Procedure Laterality Date  . Diagnostic laparoscopy    . Cesarean section  2007  . Appendectomy    . Laparoscopy  10/30/2011    Procedure: LAPAROSCOPY DIAGNOSTIC;  Surgeon: Lovenia Kim, MD;  Location: [facility];  Service: Gynecology;  Laterality: N/A;   Family History  Problem Relation Age of Onset  . COPD Mother   . Heart attack Father    History  Substance Use Topics  . Smoking status: Never Smoker   . Smokeless tobacco: Not on file  . Alcohol Use: Yes     Comment: occasionally   OB History   Grav Para Term Preterm Abortions TAB SAB Ect Mult Living                 Review of Systems  Cardiovascular: Positive for chest pain and palpitations.  All other systems reviewed and are negative.     Allergies  Review of patient's allergies indicates no known allergies.  Home Medications   Prior to Admission medications   Medication Sig Start Date End Date Taking? Authorizing Provider  Vortioxetine HBr (BRINTELLIX) 10 MG TABS Take 10 mg by mouth daily.   Yes Historical Provider, MD  buPROPion  (WELLBUTRIN) 100 MG tablet Take 100 mg by mouth daily.     Historical Provider, MD  ibuprofen (ADVIL,MOTRIN) 200 MG tablet Take 800 mg by mouth every 8 (eight) hours as needed. For Headaches.    Historical Provider, MD  Multiple Vitamin (MULITIVITAMIN WITH MINERALS) TABS Take 1 tablet by mouth daily. Uses [employer] brand.    Historical Provider, MD  QUEtiapine (SEROQUEL) 25 MG tablet Take 25 mg by mouth at bedtime as needed. Anxiety/depression.    Historical Provider, MD   BP 104/68  Pulse 105  Resp 16  SpO2 100%  LMP 01/13/2014 Physical Exam  Nursing note and vitals reviewed. Constitutional: She is oriented to person, place, and time.  Uncomfortable   HENT:  Head: Normocephalic.  Mouth/Throat: Oropharynx is clear and moist.  Eyes: Conjunctivae are normal. Pupils are equal, round, and reactive to light.  Neck: Normal range of motion. Neck supple.  Cardiovascular:  Tachycardic, irregular   Pulmonary/Chest: Effort normal and breath sounds normal. No respiratory distress. She has no wheezes. She has no rales.  Abdominal: Soft. Bowel sounds are normal. She exhibits no distension. There is no tenderness. There is no rebound and no guarding.  Musculoskeletal: Normal range of motion. She exhibits no edema and no tenderness.  Neurological: She is alert and oriented to person, place, and time. No  cranial nerve deficit. Coordination normal.  Skin: Skin is warm and dry.  Psychiatric: She has a normal mood and affect. Her behavior is normal. Judgment and thought content normal.    ED Course  Procedures (including critical care time)  CRITICAL CARE Performed by: Darl Householder, DAVID   Total critical care time: 30 min   Critical care time was exclusive of separately billable procedures and treating other patients.  Critical care was necessary to treat or prevent imminent or life-threatening deterioration.  Critical care was time spent personally by me on the following activities: development  of treatment plan with patient and/or surrogate as well as nursing, discussions with consultants, evaluation of patient's response to treatment, examination of patient, obtaining history from patient or surrogate, ordering and performing treatments and interventions, ordering and review of laboratory studies, ordering and review of radiographic studies, pulse oximetry and re-evaluation of patient's condition.   Labs Review Labs Reviewed  COMPREHENSIVE METABOLIC PANEL - Abnormal; Notable for the following:    Potassium 3.5 (*)    GFR calc non Af Amer 74 (*)    GFR calc Af Amer 86 (*)    All other components within normal limits  I-STAT CHEM 8, ED - Abnormal; Notable for the following:    Potassium 3.5 (*)    All other components within normal limits  CBC WITH DIFFERENTIAL  D-DIMER, QUANTITATIVE  Randolm Idol, ED    Imaging Review Dg Chest Port 1 View  02/03/2014   CLINICAL DATA:  Sharp left-sided chest pain.  EXAM: PORTABLE CHEST - 1 VIEW  COMPARISON:  None.  FINDINGS: The heart size and mediastinal contours are within normal limits. Both lungs are clear. The visualized skeletal structures are unremarkable.  IMPRESSION: No active disease.   Electronically Signed   By: Lawrence Santiago M.D.   On: 02/03/2014 19:46     EKG Interpretation   Date/Time:  Saturday February 03 2014 19:21:07 EDT Ventricular Rate:  141 PR Interval:  115 QRS Duration: 90 QT Interval:  279 QTC Calculation: 427 R Axis:   46 Text Interpretation:  Atrial fibrillation with rapid V-rate RSR' in V1 or  V2, probably normal variant ST depression, probably rate related afib and  junctional rhythm Confirmed by YAO  MD, DAVID ([id number]) on 02/03/2014 8:43:55  PM      MDM   Final diagnoses:  None    [name] is a 43 y.o. female here with palpitations. Initial ekg showed rapid afib vs svt. Given adenosine 6mg  then 12 mg then 12 mg and rate slowed down initially. It revealed likely underlying afib. She became  tachycardic again in rapid afib. Given cardizem and started on cardizem drip. Patient will be admitted.     Wandra Arthurs, MD 02/03/14 2045

## 2014-02-03 NOTE — H&P (Addendum)
Admit date: 02/03/2014 Referring Physician:  Dr. Darl Householder Primary Cardiologist:  Dr. Marlou Porch Chief complaint/reason for admission::  Palpitations with afib with RVR  HPI: This is a 43yo WF with a history of OCD, anxiety and depression who has been having palpitations over the past few years but recently have increased in frequency, severity and duration.  Initially she said her heart would just skip a few beats for a few minutes and resolve.  The last episode occurred about 2 weeks ago lasting 2-3 hours and resolved.  This she awakened with palpitations and felt very dizzy.  She started having aching jaw pain about an hour before she came to the ER along with sharp left sided chest pain. She was nauseated but denied any diaphoresis.  She was found to be in SVT and was given several rounds of adenosine that would slow the heart rate down and then was noted to be in atrial fibrillation with RVR.  She was given IV Cardizem and initially converted to NSR but then went back into afib with RVR.  Currently she is in sinus tachycardia at 105bpm.  Currently she is pain free.    PMH:    Past Medical History  Diagnosis Date  . Complication of anesthesia     states she extubated herself in OR when waking from anesthersia  . OCD (obsessive compulsive disorder)   . Insomnia   . Anxiety   . Depression     PSH:    Past Surgical History  Procedure Laterality Date  . Diagnostic laparoscopy    . Cesarean section  2007  . Appendectomy    . Laparoscopy  10/30/2011    Procedure: LAPAROSCOPY DIAGNOSTIC;  Surgeon: Lovenia Kim, MD;  Location: [facility];  Service: Gynecology;  Laterality: N/A;    ALLERGIES:   Review of patient's allergies indicates no known allergies.  Prior to Admit Meds:   (Not in a hospital admission) Family HX:    Family History  Problem Relation Age of Onset  . COPD Mother   . Heart attack Father    Social HX:    History   Social History  . Marital Status: Married    Spouse Name: N/A     Number of Children: N/A  . Years of Education: N/A   Occupational History  . Not on file.   Social History Main Topics  . Smoking status: Never Smoker   . Smokeless tobacco: Not on file  . Alcohol Use: Yes     Comment: occasionally  . Drug Use: No  . Sexual Activity: Not on file   Other Topics Concern  . Not on file   Social History Narrative  . No narrative on file     ROS:  All 11 ROS were addressed and are negative except what is stated in the HPI  PHYSICAL EXAM Filed Vitals:   02/03/14 2015  BP: 104/68  Pulse: 105  Resp: 16   General: Well developed, well nourished, in no acute distress Head: Eyes PERRLA, No xanthomas.   Normal cephalic and atramatic  Lungs:   Clear bilaterally to auscultation and percussion. Heart:   HRRR S1 S2 Pulses are 2+ & equal.            No carotid bruit. No JVD.  No abdominal bruits. No femoral bruits. Abdomen: Bowel sounds are positive, abdomen soft and non-tender without masses Extremities:   No clubbing, cyanosis or edema.  DP +1 Neuro: Alert and oriented X 3. Psych:  Good affect, responds appropriately   Labs:   Lab Results  Component Value Date   WBC 8.5 02/03/2014   HGB 14.6 02/03/2014   HCT 43.0 02/03/2014   MCV 88.5 02/03/2014   PLT 212 02/03/2014     Recent Labs Lab 02/03/14 1929 02/03/14 1943  NA 139 142  K 3.5* 3.5*  CL 102 104  CO2 22  --   BUN 18 19  CREATININE 0.94 1.00  CALCIUM 9.5  --   PROT 7.0  --   BILITOT 0.4  --   ALKPHOS 42  --   ALT 9  --   AST 14  --   GLUCOSE 97 95   No results found for this basename: CKTOTAL,  CKMB,  CKMBINDEX,  TROPONINI   No results found for this basename: PTT   No results found for this basename: INR,  PROTIME     No results found for this basename: CHOL   No results found for this basename: HDL   No results found for this basename: LDLCALC   No results found for this basename: TRIG   No results found for this basename: CHOLHDL   No results found for this  basename: LDLDIRECT      Radiology:  No results found.  EKG:  Atrial fibrillation with RVR at 240bpm with ST depression inferolaterally  ASSESSMENT:  1.  Atrial fibrillation with RVR now in sinus tachycardia at 105bpm after IV Cardizem 2.  Chest pain most likely secondary to #1 with associated jaw pain now resolved.  She did have ST depression inferolaterally during the afib and has a family history of CAD.  She has never had CP prior to today. 3.  OCD 4.  Depression/Anxiety 5.  Hypotension - resolved after IVF and conversion of afib  PLAN:   1.  Admit to tele bed 2.  Cycle Cardiac enzymes 3.  IV Heparin gtt until rule out complete 4.  Hold Lopressor secondary to borderline low BP 5.  Check TSH - she has a family history of hyperthyroidism 6.  Check 2D echo in am to assess LVF 7.  Check d-dimer 8.  Replete potassium 9.  Outpatient nuclear stress test due to chest pain with ST depression during tachycardia  Sueanne Margarita, MD  02/03/2014  8:25 PM

## 2014-02-03 NOTE — ED Notes (Signed)
Dr Darl Householder at bedside.  Preparing for adenosine admistration.

## 2014-02-03 NOTE — Progress Notes (Addendum)
ANTICOAGULATION CONSULT NOTE - Initial Consult  Pharmacy Consult for Heparin  Indication: atrial fibrillation  No Known Allergies  Patient Measurements: Height: 5\' 6"  (167.6 cm) Weight: 139 lb 11.2 oz (63.368 kg) IBW/kg (Calculated) : 59.3  Vital Signs: Temp: 98.5 F (36.9 C) (06/13 2259) BP: 108/71 mmHg (06/13 2259) Pulse Rate: 86 (06/13 2259)  Labs:  Recent Labs  02/03/14 1929 02/03/14 1943  HGB 13.5 14.6  HCT 40.1 43.0  PLT 212  --   CREATININE 0.94 1.00   Estimated Creatinine Clearance: 69.3 ml/min (by C-G formula based on Cr of 1).  Medical History: Past Medical History  Diagnosis Date  . Complication of anesthesia     states she extubated herself in OR when waking from anesthersia  . OCD (obsessive compulsive disorder)   . Insomnia   . Anxiety   . Depression    Assessment: 43 y/o F with new onset afib/RVR to start heparin per pharmacy. Also cycling cardiac enzymes to r/o ACS given some ST depression. CBC good, renal function good, other labs as above.   Goal of Therapy:  Heparin level 0.3-0.7 units/ml Monitor platelets by anticoagulation protocol: Yes   Plan:  -Heparin 3000 units BOLUS x 1 -Start heparin drip at 1000 units/hr -0600 HL -Daily CBC/HL -Monitor for bleeding  Narda Bonds 02/03/2014,11:09 PM  Addendum 5:14 AM First HL is 0.49, continue heparin at 1000 units/hr, confirmatory HL at [place], PharmD

## 2014-02-03 NOTE — ED Notes (Signed)
Pt reports she woke this morning with a funny feeling in her chest which has come and gone several times throughout the day.  Upon arrival in room, HR >200.

## 2014-02-03 NOTE — ED Notes (Signed)
The pt has had a rapid irregular heart rate  Today followed by dizziness nausea and sob.  She has pain just below her lt breast at present and she reports that she passed out momentarily.  She had a similar episode last week.  .  lmp 3 weeks ago

## 2014-02-03 NOTE — ED Notes (Signed)
Dr Radford Pax in room to see pt.

## 2014-02-03 NOTE — ED Notes (Signed)
Pt to ct, then to floor on monitor.

## 2014-02-04 ENCOUNTER — Encounter (HOSPITAL_COMMUNITY): Payer: Self-pay | Admitting: Physician Assistant

## 2014-02-04 DIAGNOSIS — F419 Anxiety disorder, unspecified: Secondary | ICD-10-CM | POA: Diagnosis present

## 2014-02-04 DIAGNOSIS — F329 Major depressive disorder, single episode, unspecified: Secondary | ICD-10-CM | POA: Diagnosis present

## 2014-02-04 DIAGNOSIS — I4891 Unspecified atrial fibrillation: Secondary | ICD-10-CM

## 2014-02-04 DIAGNOSIS — F429 Obsessive-compulsive disorder, unspecified: Secondary | ICD-10-CM | POA: Diagnosis present

## 2014-02-04 DIAGNOSIS — F32A Depression, unspecified: Secondary | ICD-10-CM | POA: Diagnosis present

## 2014-02-04 LAB — CBC
HEMATOCRIT: 35.1 % — AB (ref 36.0–46.0)
Hemoglobin: 11.5 g/dL — ABNORMAL LOW (ref 12.0–15.0)
MCH: 29.4 pg (ref 26.0–34.0)
MCHC: 32.8 g/dL (ref 30.0–36.0)
MCV: 89.8 fL (ref 78.0–100.0)
Platelets: 185 10*3/uL (ref 150–400)
RBC: 3.91 MIL/uL (ref 3.87–5.11)
RDW: 13.2 % (ref 11.5–15.5)
WBC: 7.4 10*3/uL (ref 4.0–10.5)

## 2014-02-04 LAB — CBC WITH DIFFERENTIAL/PLATELET
BASOS ABS: 0 10*3/uL (ref 0.0–0.1)
BASOS PCT: 0 % (ref 0–1)
EOS PCT: 4 % (ref 0–5)
Eosinophils Absolute: 0.4 10*3/uL (ref 0.0–0.7)
HCT: 38 % (ref 36.0–46.0)
HEMOGLOBIN: 12.6 g/dL (ref 12.0–15.0)
LYMPHS PCT: 33 % (ref 12–46)
Lymphs Abs: 2.7 10*3/uL (ref 0.7–4.0)
MCH: 29.9 pg (ref 26.0–34.0)
MCHC: 33.2 g/dL (ref 30.0–36.0)
MCV: 90 fL (ref 78.0–100.0)
Monocytes Absolute: 0.9 10*3/uL (ref 0.1–1.0)
Monocytes Relative: 11 % (ref 3–12)
Neutro Abs: 4.3 10*3/uL (ref 1.7–7.7)
Neutrophils Relative %: 52 % (ref 43–77)
Platelets: 206 10*3/uL (ref 150–400)
RBC: 4.22 MIL/uL (ref 3.87–5.11)
RDW: 12.9 % (ref 11.5–15.5)
WBC: 8.3 10*3/uL (ref 4.0–10.5)

## 2014-02-04 LAB — COMPREHENSIVE METABOLIC PANEL
ALBUMIN: 3.5 g/dL (ref 3.5–5.2)
ALT: 10 U/L (ref 0–35)
AST: 17 U/L (ref 0–37)
Alkaline Phosphatase: 40 U/L (ref 39–117)
BILIRUBIN TOTAL: 0.3 mg/dL (ref 0.3–1.2)
BUN: 13 mg/dL (ref 6–23)
CHLORIDE: 104 meq/L (ref 96–112)
CO2: 23 mEq/L (ref 19–32)
Calcium: 8.3 mg/dL — ABNORMAL LOW (ref 8.4–10.5)
Creatinine, Ser: 0.86 mg/dL (ref 0.50–1.10)
GFR calc Af Amer: >90 mL/min (ref >90)
GFR calc non Af Amer: 83 mL/min — ABNORMAL LOW (ref >90)
Glucose, Bld: 73 mg/dL (ref 70–99)
Potassium: 4.3 mEq/L (ref 3.7–5.3)
Sodium: 141 mEq/L (ref 137–147)
Total Protein: 6.3 g/dL (ref 6.0–8.3)

## 2014-02-04 LAB — BASIC METABOLIC PANEL
BUN: 12 mg/dL (ref 6–23)
CHLORIDE: 105 meq/L (ref 96–112)
CO2: 22 mEq/L (ref 19–32)
Calcium: 8.3 mg/dL — ABNORMAL LOW (ref 8.4–10.5)
Creatinine, Ser: 0.8 mg/dL (ref 0.50–1.10)
GFR calc Af Amer: >90 mL/min (ref >90)
Glucose, Bld: 84 mg/dL (ref 70–99)
Potassium: 4.1 mEq/L (ref 3.7–5.3)
SODIUM: 141 meq/L (ref 137–147)

## 2014-02-04 LAB — HEPARIN LEVEL (UNFRACTIONATED)
Heparin Unfractionated: 0.49 IU/mL (ref 0.30–0.70)
Heparin Unfractionated: 0.5 IU/mL (ref 0.30–0.70)

## 2014-02-04 LAB — TSH: TSH: 5.72 u[IU]/mL — ABNORMAL HIGH (ref 0.350–4.500)

## 2014-02-04 LAB — PROTIME-INR
INR: 1.19 (ref 0.00–1.49)
Prothrombin Time: 14.8 seconds (ref 11.6–15.2)

## 2014-02-04 LAB — TROPONIN I: Troponin I: <0.3 ng/mL (ref <0.30)

## 2014-02-04 LAB — T4, FREE: FREE T4: 1.12 ng/dL (ref 0.80–1.80)

## 2014-02-04 LAB — APTT: aPTT: 34 seconds (ref 24–37)

## 2014-02-04 LAB — MAGNESIUM: Magnesium: 2.1 mg/dL (ref 1.5–2.5)

## 2014-02-04 MED ORDER — DILTIAZEM HCL ER COATED BEADS 180 MG PO CP24
180.0000 mg | ORAL_CAPSULE | Freq: Every day | ORAL | Status: DC
  Administered 2014-02-04: 180 mg via ORAL
  Filled 2014-02-04: qty 1

## 2014-02-04 MED ORDER — DILTIAZEM HCL ER COATED BEADS 180 MG PO CP24: 180.0000 mg | ORAL_CAPSULE | Freq: Every day | ORAL | Status: DC

## 2014-02-04 NOTE — Progress Notes (Signed)
  Echocardiogram 2D Echocardiogram has been performed.  [name] FRANCES 02/04/2014, 2:56 PM

## 2014-02-04 NOTE — Discharge Summary (Signed)
Discharge Summary   Patient ID: [name], MRN: [id number], DOB/AGE: 01/18/1971 43 y.o.  Admit date: 02/03/2014 Discharge date: 02/04/2014   Primary Care Physician:  Lovenia Kim   Primary Cardiologist:  Will be referred to Dr. [name]    Reason for Admission:  SVT   Primary Discharge Diagnoses:  Principal Problem:   Atrial fibrillation with RVR - converted to NSR on Diltiazem (CHADS2=0) Active Problems:   OCD (obsessive compulsive disorder)   Anxiety   Depression     Wt Readings from Last 3 Encounters:  02/03/14 139 lb 11.2 oz (63.368 kg)  02/17/13 132 lb (59.875 kg)  10/20/11 141 lb (63.957 kg)    Secondary Discharge Diagnoses:   Past Medical History  Diagnosis Date  . OCD (obsessive compulsive disorder)   . Insomnia   . Anxiety   . Depression   . Atrial fibrillation     adm 01/2014      Allergies:   No Known Allergies    Procedures Performed This Admission:   ECHO  Hospital Course:  [name] is a 43 y.o. female (husband is a Stage manager in town) with a hx of OCD, anxiety and depression with a prior hx of palpitations.  She she was awoken by palpitations and dizziness and presented to the ED.  She was found to be in SVT and given several rounds of Adenosine.  Her HR slowed and then she was noted to be in AFib with RVR.  She converted to NSR on IV Diltiazem briefly, then back in AFib, then back to NSR.   Highest HR was 240.  She had some aching jaw pain and sharp chest pain associated with her symptoms.  She was hypokalemic and K+ was replaced.  TSH was mildly elevated but FT4 was normal.  DDimer was elevated.  Chest CTA was negative for pulmonary embolism.  She remained in NSR overnight.  CEs remained negative.  She had no further chest pain.  Echocardiogram has been ordered and is currently pending.  Initially, outpatient stress testing was considered.  However, the patient was seen by Dr. Wynonia Lawman today who felt that this could be placed on hold with  neg cardiac enzymes.  He felt she needed an outpatient evaluation with EP (Dr. [name]) initially before any further testing is ordered. Dr. Wynonia Lawman feels that she is stable enough for discharge to home (after her echo is completed).  She will go home on Diltiazem 180 mg QD.  It is recommended that she stop her methylphenidate.  CHADS2-VASc=0.  She does not require anticoagulation.     Discharge Vitals:   Blood pressure 98/62, pulse 101, temperature 98.2 F (36.8 C), temperature source Oral, resp. rate 17, height 5\' 6"  (1.676 m), weight 139 lb 11.2 oz (63.368 kg), last menstrual period 01/13/2014, SpO2 100.00%.   Labs:   Recent Labs  02/03/14 1929 02/03/14 1943 02/03/14 2339 02/04/14 0428  WBC 8.5  --  8.3 7.4  HGB 13.5 14.6 12.6 11.5*  HCT 40.1 43.0 38.0 35.1*  MCV 88.5  --  90.0 89.8  PLT 212  --  206 185     Recent Labs  02/03/14 1929 02/03/14 1943 02/03/14 2339 02/04/14 0428  NA 139 142 141 141  K 3.5* 3.5* 4.3 4.1  CL 102 104 104 105  CO2 22  --  23 22  BUN 18 19 13 12   CREATININE 0.94 1.00 0.86 0.80  CALCIUM 9.5  --  8.3* 8.3*  PROT 7.0  --  6.3  --   BILITOT 0.4  --  0.3  --   ALKPHOS 42  --  40  --   ALT 9  --  10  --   AST 14  --  17  --      Recent Labs  02/03/14 2339 02/04/14 0428 02/04/14 1052  TROPONINI <0.30 <0.30 <0.30     Lab Results  Component Value Date   DDIMER 4.30* 02/03/2014    Lab Results  Component Value Date   TSH 5.720* 02/03/2014    02/03/2014: Free T4 1.12    Recent Labs  02/03/14 2339  INR 1.19     Diagnostic Procedures and Studies:  Ct Angio Chest W/cm &/or Wo Cm   02/03/2014      IMPRESSION: No evidence of pulmonary embolism.  No evidence of acute cardiopulmonary disease.   Electronically Signed   By: Julian Hy M.D.   On: 02/03/2014 23:23    Dg Chest Port 1 View   02/03/2014     IMPRESSION: No active disease.   Electronically Signed   By: Lawrence Santiago M.D.   On: 02/03/2014 19:46     2D  Echocardiogram Pending   Disposition:   Pt is being discharged home today in good condition.  Follow-up Plans & Appointments      Follow-up Information   Follow up with [name], MD In 2 weeks. (office will call to make an appointment)    Specialty:  Cardiology   Contact information:   [address] [place] [address] [phone]       Discharge Medications    Medication List    STOP taking these medications       methylphenidate 27 MG CR tablet  Commonly known as:  CONCERTA      TAKE these medications       BRINTELLIX 10 MG Tabs  Generic drug:  Vortioxetine HBr  Take 10 mg by mouth daily.     buPROPion 100 MG tablet  Commonly known as:  WELLBUTRIN  Take 100 mg by mouth daily.     diltiazem 180 MG 24 hr capsule  Commonly known as:  CARDIZEM CD  Take 1 capsule (180 mg total) by mouth daily.     ibuprofen 200 MG tablet  Commonly known as:  ADVIL,MOTRIN  Take 800 mg by mouth every 6 (six) hours as needed for headache.     multivitamin with minerals Tabs tablet  Take 1 tablet by mouth at bedtime. Perfect Fit MultiVitamin     QUEtiapine 25 MG tablet  Commonly known as:  SEROQUEL  Take 12.5 mg by mouth at bedtime as needed (sleep).         Outstanding Labs/Studies  1. None   Duration of Discharge Encounter: Greater than 30 minutes including physician and PA time.  Signed, Richardson Dopp, PA-C   02/04/2014 3:22 PM     Agree with above and exam.  See my note from earlier in the day.  Spoke with husband and patient and will ask EP to see in followup.  Will defer stress testing for now in light of negative enzymes.  Repeat EKG is normal.    W. Doristine Church MD [facility]

## 2014-02-04 NOTE — Progress Notes (Signed)
Subjective:  Brought in last night with atrial fibrillation that started during a birthday party yesterday. May have had some intermittent palpitations previous to that. She has been taking Concerta but last dose was about 3 days ago. Husband is a Stage manager here in town.  Objective:  Vital Signs in the last 24 hours: BP 93/52  Pulse 85  Temp(Src) 98.3 F (36.8 C) (Oral)  Resp 20  Ht 5\' 6"  (1.676 m)  Wt 63.368 kg (139 lb 11.2 oz)  BMI 22.56 kg/m2  SpO2 99%  LMP 01/13/2014  Physical Exam: Pleasant thin white female in no acute distress Lungs:  Clear Cardiac:  Regular rhythm, normal S1 and S2, no S3  Intake/Output from previous day: 06/13 0701 - 06/14 0700 In: 1000 [I.V.:1000] Out: 1350 [Urine:1350]  Weight Filed Weights   02/03/14 2259  Weight: 63.368 kg (139 lb 11.2 oz)    Lab Results: Basic Metabolic Panel:  Recent Labs  02/03/14 2339 02/04/14 0428  NA 141 141  K 4.3 4.1  CL 104 105  CO2 23 22  GLUCOSE 73 84  BUN 13 12  CREATININE 0.86 0.80   CBC:  Recent Labs  02/03/14 1929  02/03/14 2339 02/04/14 0428  WBC 8.5  --  8.3 7.4  NEUTROABS 4.1  --  4.3  --   HGB 13.5  < > 12.6 11.5*  HCT 40.1  < > 38.0 35.1*  MCV 88.5  --  90.0 89.8  PLT 212  --  206 185  < > = values in this interval not displayed. Cardiac Enzymes:  Recent Labs  02/03/14 2339 02/04/14 0428  TROPONINI <0.30 <0.30    Telemetry: Sinus rhythm overnight  EKG today is completely normal  Assessment/Plan:  1. Paroxysmal atrial fibrillation resolved 2. OCD and history of depression 3. Chest pain resolved with negative enzymes  Recommendations:  Obtain echocardiogram. Stop heparin. Begin diltiazem orally. Once echo has been done can probably go home. Followup with electrophysiology in one to 2 weeks.      Kerry Hough  MD [facility]  02/04/2014, 11:10 AM

## 2014-02-21 ENCOUNTER — Encounter: Payer: Self-pay | Admitting: Internal Medicine

## 2014-02-21 ENCOUNTER — Ambulatory Visit (INDEPENDENT_AMBULATORY_CARE_PROVIDER_SITE_OTHER): Payer: PRIVATE HEALTH INSURANCE | Admitting: Internal Medicine

## 2014-02-21 VITALS — BP 111/77 | HR 81 | Ht 66.0 in | Wt 139.6 lb

## 2014-02-21 DIAGNOSIS — R0683 Snoring: Secondary | ICD-10-CM | POA: Insufficient documentation

## 2014-02-21 DIAGNOSIS — R0989 Other specified symptoms and signs involving the circulatory and respiratory systems: Secondary | ICD-10-CM

## 2014-02-21 DIAGNOSIS — I4891 Unspecified atrial fibrillation: Secondary | ICD-10-CM

## 2014-02-21 DIAGNOSIS — R0609 Other forms of dyspnea: Secondary | ICD-10-CM

## 2014-02-21 MED ORDER — DILTIAZEM HCL 30 MG PO TABS: 30.0000 mg | ORAL_TABLET | Freq: Four times a day (QID) | ORAL | Status: DC

## 2014-02-21 NOTE — Patient Instructions (Addendum)
Your physician recommends that you schedule a follow-up appointment in: 6 weeks with Dr Rayann Heman  Your physician recommends that you return for lab work in: TSH/Free T4  Your physician has recommended that you have a sleep study. This test records several body functions during sleep, including: brain activity, eye movement, oxygen and carbon dioxide blood levels, heart rate and rhythm, breathing rate and rhythm, the flow of air through your mouth and nose, snoring, body muscle movements, and chest and belly movement.  Your physician has recommended that you wear an event monitor. Event monitors are medical devices that record the heart's electrical activity. Doctors most often [place] these monitors to diagnose arrhythmias. Arrhythmias are problems with the speed or rhythm of the heartbeat. The monitor is a small, portable device. You can wear one while you do your normal daily activities. This is usually used to diagnose what is causing palpitations/syncope (passing out).   Your physician has recommended you make the following change in your medication:  1) Take 30mg  of Cardizem as needed for fast heartrate

## 2014-02-21 NOTE — Progress Notes (Signed)
Primary Care Physician: Lovenia Kim, MD Primary Cardiologist:  Dr Cammie Sickle is a 43 y.o. female with a h/o OCD/anxiety, depression,  ADD, and recently diagnosed atrial fibrillation who presents for EP evaluation in the afib clinic.  She presented to the ER at [facility] 02/03/14 with tachycardia/syncope. Ekg revealed afib with RVR.  Though her tachycardia appeared regular at times, adenosine confirmed underlying atrial fibrillation (this is actually captured on an ekg on epic).  Her ventricular rate was around 200. She reports having afib four 3-4 hours.  She became progressively fatigued and collapsed at home.  She reports similar irregular tachypalpitations 2-3 weeks prior.  These palpitations were shortlived and did not result in syncope.  She did not seek medical attention for that episode.  She has had very brief (1-2 seconds) of palpitations for years which she has attributed to pacs/pvcs.  Recently, she has had rapid irregular pulse lasting 2-3 minutes in the evening when her husband (a physician) checks her pulse while they are relaxing.  She has been placed on diltaizem but is still aware of some nonsustained increased heart rate at times.  She does have FH of thyroid disease in the mother and two sisters and full thyroid panel to be obtained today. She was also on concerta prn for ADD but this was stopped in the ER. She drinks 2-3 glasses of wine per week but has stopped since her recent ER visit.  Importantly, she does have significant snoring history, without obvious body habitus, with husband noting apneic episodes, with symptoms of daytime somnolence, fatigue, morning headaches, weight gain.  Echo was performed without significant abnormalities. EKG today normal sinus rhythm.  Today, she denies symptoms of chest pain, shortness of breath, orthopnea, PND, lower extremity edema, dizziness, presyncope,further syncope, or neurologic sequela. The patient is tolerating medications without  difficulties and is otherwise without complaint today.   Past Medical History  Diagnosis Date  . OCD (obsessive compulsive disorder)   . Insomnia   . Anxiety   . Depression   . Atrial fibrillation     adm 01/2014  . Atrial fibrillation with RVR   . SVT (supraventricular tachycardia)    Past Surgical History  Procedure Laterality Date  . Diagnostic laparoscopy    . Cesarean section  2007  . Appendectomy    . Laparoscopy  10/30/2011    Procedure: LAPAROSCOPY DIAGNOSTIC;  Surgeon: Lovenia Kim, MD;  Location: [facility];  Service: Gynecology;  Laterality: N/A;    Current Outpatient Prescriptions  Medication Sig Dispense Refill  . buPROPion (WELLBUTRIN XL) 300 MG 24 hr tablet Take 300 mg by mouth daily.      Marland Kitchen diltiazem (CARDIZEM CD) 180 MG 24 hr capsule Take 1 capsule (180 mg total) by mouth daily.  30 capsule  6  . ibuprofen (ADVIL,MOTRIN) 200 MG tablet Take 800 mg by mouth every 6 (six) hours as needed for headache.       . Multiple Vitamin (MULITIVITAMIN WITH MINERALS) TABS Take 1 tablet by mouth at bedtime. Perfect Fit MultiVitamin      . QUEtiapine (SEROQUEL) 25 MG tablet Take 12.5 mg by mouth at bedtime as needed (sleep).       . Vortioxetine HBr 5 MG TABS Take 1 tablet by mouth at bedtime.       No current facility-administered medications for this visit.    No Known Allergies  History   Social History  . Marital Status: Married    Spouse Name: N/A  Number of Children: N/A  . Years of Education: N/A   Occupational History  . Not on file.   Social History Main Topics  . Smoking status: Never Smoker   . Smokeless tobacco: Not on file  . Alcohol Use: Yes     Comment: occasionally  . Drug Use: No  . Sexual Activity: Not on file   Other Topics Concern  . Not on file   Social History Narrative  . No narrative on file    Family History  Problem Relation Age of Onset  . COPD Mother   . Heart attack Father     ROS- All systems are reviewed and negative  except as per the HPI above  Physical Exam: Filed Vitals:   02/21/14 1618  BP: 111/77  Pulse: 81  Height: 5\' 6"  (1.676 m)  Weight: 63.322 kg (139 lb 9.6 oz)    GEN- The patient is well appearing, alert and oriented x 3 today.   Head- normocephalic, atraumatic Eyes-  Sclera clear, conjunctiva pink Ears- hearing intact Oropharynx- clear Neck- supple, no JVP Lymph- no cervical lymphadenopathy Lungs- Clear to ausculation bilaterally, normal work of breathing Heart- Regular rate and rhythm, no murmurs, rubs or gallops, PMI not laterally displaced GI- soft, NT, ND, + BS Extremities- no clubbing, cyanosis, or edema MS- no significant deformity or atrophy Skin- no rash or lesion, tattoo on her ankle is noted Psych- euthymic mood, full affect Neuro- strength and sensation are intact  EKG- NSR. ECHO- Normal EF at 55%, no obvious abnormalities. Epic records including Dr Ezekiel Slocumb and Dr Landis Gandy notes are reviewed  Assessment and Plan: 1.  Paroxysmal atrial fibrillation The patient has symptomatic recurrent atrial fibrillation.  Though her arrhythmia was initially very rapid and almost appeared regular, adenosine did not terminate the tachycardia and confirmed underlying afib.  She has been placed on diltiazem.  She continues to have short episodes of palpitations. At this point, I would recommend that she continue diltiazem.  I will also provide short acting diltiazem for break through episodes.  I will place a 30 day monitor to evaluate for atrial arrhythmias and to characterize the burden.  If she continues to have afib then we should consider flecainide 50mg  BID. Her chads2vasc score is 1 (female).  I have therefore not initiated anticoagulation today. ETOH moderation is encouarged Given symptoms of sleep apnea, we will order a sleep study at this time (see below). Check tfts as a cause for her arrhythmia IF she fails medical therapy then we could consider ablation.  2. Snoring She  has symptoms worrisome for sleep apnea She does not have obesity If she is diagnosed with sleep apnea, I think that referral to ENT may be prudent to consider airway as the cause for her apnea  Return in 6 weeks to see Roderic Palau in the afib clinic for follow-up

## 2014-02-22 ENCOUNTER — Other Ambulatory Visit: Payer: Self-pay | Admitting: *Deleted

## 2014-02-22 MED ORDER — DILTIAZEM HCL 30 MG PO TABS: 30.0000 mg | ORAL_TABLET | Freq: Four times a day (QID) | ORAL | Status: DC

## 2014-02-22 NOTE — Telephone Encounter (Signed)
Patient had already left office and I was informed she needed short acring Cardizem.  I have called the medication in and she is aware.  Dr Rayann Heman had discussed with her at the office

## 2014-03-05 ENCOUNTER — Encounter (INDEPENDENT_AMBULATORY_CARE_PROVIDER_SITE_OTHER): Payer: PRIVATE HEALTH INSURANCE

## 2014-03-05 ENCOUNTER — Other Ambulatory Visit (INDEPENDENT_AMBULATORY_CARE_PROVIDER_SITE_OTHER): Payer: PRIVATE HEALTH INSURANCE

## 2014-03-05 ENCOUNTER — Encounter: Payer: Self-pay | Admitting: *Deleted

## 2014-03-05 DIAGNOSIS — I4891 Unspecified atrial fibrillation: Secondary | ICD-10-CM

## 2014-03-05 NOTE — Progress Notes (Signed)
Patient ID: [name], female   DOB: 1971-03-27, 43 y.o.   MRN: [id number] [employer] 30 day cardiac event monitor applied to patient.

## 2014-03-06 LAB — TSH: TSH: 2.16 u[IU]/mL (ref 0.35–4.50)

## 2014-03-06 LAB — T4, FREE: Free T4: 0.95 ng/dL (ref 0.60–1.60)

## 2014-04-09 ENCOUNTER — Telehealth: Payer: Self-pay | Admitting: Internal Medicine

## 2014-04-09 NOTE — Telephone Encounter (Signed)
New problem:     Pt called back to make her 6 wk follow, pt would like a sooner appt late in the day if possible.

## 2014-04-09 NOTE — Telephone Encounter (Signed)
I cannot help with this. Thanks

## 2014-04-17 ENCOUNTER — Ambulatory Visit (HOSPITAL_BASED_OUTPATIENT_CLINIC_OR_DEPARTMENT_OTHER): Payer: PRIVATE HEALTH INSURANCE | Attending: Pulmonary Disease

## 2014-04-17 VITALS — Ht 66.0 in | Wt 139.0 lb

## 2014-04-17 DIAGNOSIS — G4710 Hypersomnia, unspecified: Secondary | ICD-10-CM | POA: Diagnosis present

## 2014-04-17 DIAGNOSIS — G4769 Other sleep related movement disorders: Secondary | ICD-10-CM | POA: Diagnosis not present

## 2014-04-17 DIAGNOSIS — R0609 Other forms of dyspnea: Secondary | ICD-10-CM | POA: Diagnosis not present

## 2014-04-17 DIAGNOSIS — G4730 Sleep apnea, unspecified: Principal | ICD-10-CM

## 2014-04-17 DIAGNOSIS — I4891 Unspecified atrial fibrillation: Secondary | ICD-10-CM

## 2014-04-17 DIAGNOSIS — R0989 Other specified symptoms and signs involving the circulatory and respiratory systems: Secondary | ICD-10-CM | POA: Insufficient documentation

## 2014-04-17 DIAGNOSIS — G4733 Obstructive sleep apnea (adult) (pediatric): Secondary | ICD-10-CM

## 2014-04-25 DIAGNOSIS — G4710 Hypersomnia, unspecified: Secondary | ICD-10-CM

## 2014-04-25 DIAGNOSIS — G4730 Sleep apnea, unspecified: Secondary | ICD-10-CM

## 2014-04-25 NOTE — Sleep Study (Signed)
   NAME: [name] DATE OF BIRTH:  1970-11-27 MEDICAL RECORD NUMBER [id number]  LOCATION: [facility]  PHYSICIAN: Kathee Delton  DATE OF STUDY: 04/17/2014  SLEEP STUDY TYPE: Nocturnal Polysomnogram               REFERRING PHYSICIAN: [name], MD  INDICATION FOR STUDY: Hypersomnia with sleep apnea  EPWORTH SLEEPINESS SCORE:  13 HEIGHT: 5\' 6"  (167.6 cm)  WEIGHT: 139 lb (63.05 kg)    Body mass index is 22.45 kg/(m^2).  NECK SIZE: 13.5 in.  MEDICATIONS: Reviewed in sleep record  SLEEP ARCHITECTURE: The patient had a total sleep time of 299 minutes with very little slow-wave sleep and decreased quantity of REM. Sleep onset latency was mildly prolonged at 47 minutes, and REM onset was at the upper limits of normal. Sleep efficiency was mildly reduced at 81%.  RESPIRATORY DATA: The patient was found to have no obstructive apneas or obstructive hypopneas, but did have mild snoring throughout the night.  OXYGEN DATA: There was oxygen desaturation as low as 93% during the study.  CARDIAC DATA: No clinically significant arrhythmias were noted  MOVEMENT/PARASOMNIA: The patient was found to have 204 periodic limb movements, but only 1.4 per hour resulting in arousal or awakening. There were no abnormal behaviors noted.  IMPRESSION/ RECOMMENDATION:    1) No clinically significant sleep disordered breathing noted. The patient did have mild snoring throughout the night.   2) large numbers of periodic limb movements, however there appeared to be very little impact to the patient's overall sleep. The patient appears to have an elevated Epworth score, and if she is having significant abnormal sleepiness, would recommend clinical correlation with the patient's history in order to rule out a movement disorder of sleep.    [place], American Board of Sleep Medicine  ELECTRONICALLY SIGNED ON:  04/25/2014, 5:59 PM [place] PH:  [phone]   FX: [phone] [place]

## 2014-05-23 ENCOUNTER — Ambulatory Visit: Payer: PRIVATE HEALTH INSURANCE | Admitting: Internal Medicine

## 2014-05-30 ENCOUNTER — Ambulatory Visit: Payer: PRIVATE HEALTH INSURANCE | Admitting: Internal Medicine

## 2014-06-01 ENCOUNTER — Other Ambulatory Visit: Payer: Self-pay

## 2014-06-01 ENCOUNTER — Encounter: Payer: Self-pay | Admitting: Internal Medicine

## 2014-06-01 ENCOUNTER — Ambulatory Visit (INDEPENDENT_AMBULATORY_CARE_PROVIDER_SITE_OTHER): Payer: PRIVATE HEALTH INSURANCE | Admitting: Internal Medicine

## 2014-06-01 VITALS — BP 104/68 | HR 84 | Ht 66.0 in | Wt 143.4 lb

## 2014-06-01 DIAGNOSIS — R002 Palpitations: Secondary | ICD-10-CM

## 2014-06-01 DIAGNOSIS — I480 Paroxysmal atrial fibrillation: Secondary | ICD-10-CM

## 2014-06-01 DIAGNOSIS — R0683 Snoring: Secondary | ICD-10-CM

## 2014-06-01 NOTE — Patient Instructions (Signed)
Your physician wants you to follow-up in: 6 months with Dr. Allred. You will receive a reminder letter in the mail two months in advance. If you don't receive a letter, please call our office to schedule the follow-up appointment.  

## 2014-06-02 DIAGNOSIS — R002 Palpitations: Secondary | ICD-10-CM | POA: Insufficient documentation

## 2014-06-02 NOTE — Progress Notes (Signed)
PCP:  Lovenia Kim, MD  The patient presents today for routine electrophysiology followup.  Since last being seen in our clinic, the patient reports doing very well.  She has had occasional PACs and PVCs but has not had sustained AF.  Since I saw her last, she has been diagnosed with vit D deficiency and diabetes.  Today, she denies symptoms of chest pain, shortness of breath, orthopnea, PND, lower extremity edema, dizziness, presyncope, syncope, or neurologic sequela.  The patient feels that she is tolerating medications without difficulties and is otherwise without complaint today.   Past Medical History  Diagnosis Date  . OCD (obsessive compulsive disorder)   . Insomnia   . Anxiety   . Depression   . Atrial fibrillation     adm 01/2014  . Atrial fibrillation with RVR   . SVT (supraventricular tachycardia)    Past Surgical History  Procedure Laterality Date  . Diagnostic laparoscopy    . Cesarean section  2007  . Appendectomy    . Laparoscopy  10/30/2011    Procedure: LAPAROSCOPY DIAGNOSTIC;  Surgeon: Lovenia Kim, MD;  Location: [facility];  Service: Gynecology;  Laterality: N/A;    Current Outpatient Prescriptions  Medication Sig Dispense Refill  . BETAINE PO Take 1-2 tablets with each meal or shake      . buPROPion (WELLBUTRIN XL) 300 MG 24 hr tablet Take 300 mg by mouth daily.      . Cholecalciferol (VITAMIN D-3) 5000 UNITS TABS Take 1 tablet by mouth daily.      Marland Kitchen CINNAMON PO Take 1/2 teaspoon daily      . Coenzyme Q10 (CO Q 10 PO) Co Q MAX Take 1 tablet every other day      . diltiazem (CARDIZEM) 30 MG tablet Take 30 mg by mouth daily as needed (AFIB).      Marland Kitchen EVENING PRIMROSE OIL PO Take 1,300 mg by mouth 2 (two) times daily.      Marland Kitchen ibuprofen (ADVIL,MOTRIN) 200 MG tablet Take 800 mg by mouth every 6 (six) hours as needed for headache.       . metFORMIN (GLUCOPHAGE) 1000 MG tablet Take 1,000 mg by mouth daily with breakfast.      . Multiple Vitamin (MULITIVITAMIN WITH  MINERALS) TABS Take 1 tablet by mouth at bedtime. Perfect Fit MultiVitamin      . NON FORMULARY RelaxMax Take 1 scoop at bedtime and as needed through the day      . NON FORMULARY SeroTrex L-Theanine Take 2      . NON FORMULARY OptiMag Neuro 1/2 tsp powder scoop twice daily      . NON FORMULARY Phosphatidylcholine Take 3 tablets daily      . NON FORMULARY Phosphotidyl serine Take 2 tablets daily      . NON FORMULARY Omega Marine Oil Liquid Take 1 tsp daily      . NON FORMULARY Chromate GTF chromium polynicotinate 600 mcg Take 1 tablet twice a day      . NON FORMULARY Celtic Sea Salt Take 1/8 tsp daily      . NON FORMULARY Unique E 400 IU Take 1 tablet daily with food in AM      . NON FORMULARY There Biotic Detoxification Support, 50 bIL One daily      . NON FORMULARY Sacro B Take 1 tablet twice daily      . Nutritional Supplements (DHEA PO) Take 2 mg by mouth daily.      Marland Kitchen  Progesterone Micronized (PROGESTERONE PO) Take on cycle days 15-28      . QUEtiapine (SEROQUEL) 25 MG tablet Take 12.5 mg by mouth at bedtime as needed (sleep).       . vitamin A 25000 UNIT capsule Take 25,000 Units by mouth daily.      . Vortioxetine HBr (BRINTELLIX) 10 MG TABS Take 1 tablet by mouth daily.      Marland Kitchen ZINC GLUCONATE PO Take 20 mg by mouth 2 (two) times daily.       No current facility-administered medications for this visit.    No Known Allergies  History   Social History  . Marital Status: Married    Spouse Name: N/A    Number of Children: N/A  . Years of Education: N/A   Occupational History  . Not on file.   Social History Main Topics  . Smoking status: Never Smoker   . Smokeless tobacco: Not on file  . Alcohol Use: Yes     Comment: occasionally  . Drug Use: No  . Sexual Activity: Not on file   Other Topics Concern  . Not on file   Social History Narrative  . No narrative on file    Family History  Problem Relation Age of Onset  . COPD Mother   . Heart attack Father     ROS-   All systems are reviewed and are negative except as outlined in the HPI above  Physical Exam: Filed Vitals:   06/01/14 1040  BP: 104/68  Pulse: 84  Height: 5\' 6"  (1.676 m)  Weight: 143 lb 6.4 oz (65.046 kg)    GEN- The patient is well appearing, alert and oriented x 3 today.   Head- normocephalic, atraumatic Eyes-  Sclera clear, conjunctiva pink Ears- hearing intact Oropharynx- clear Neck- supple, no JVP Lymph- no cervical lymphadenopathy Lungs- Clear to ausculation bilaterally, normal work of breathing Heart- Regular rate and rhythm, no murmurs, rubs or gallops, PMI not laterally displaced GI- soft, NT, ND, + BS Extremities- no clubbing, cyanosis, or edema MS- no significant deformity or atrophy Skin- no rash or lesion Psych- euthymic mood, full affect Neuro- strength and sensation are intact  Event monitor is reviewed and reveals sinus rhythm with rare pacs/ pvcs which correlate to her symptoms of palpitations.  Nonsustained atrial tachycardia is observed.  No afib  Sleep study does not reveal sleep apnea.  Assessment and Plan:  Assessment and Plan: 1.  Paroxysmal atrial fibrillation The patient has had no AF since her last visit.  She has occasional PACs/ PVCs by event monitor.  She does not feel that these warrant additional treatment at this time. If she has further afib, we should consider flecainide 50mg  BID.  She would like to avoid this presently. Her chads2vasc score is 1 (female).  I have therefore not initiated anticoagulation today. ETOH moderation is encouarged IF she fails medical therapy then we could consider ablation.  2. Snoring She does not have obesity or sleep apnea We discussed ENT referral today.  She has seen Dr Redmond Baseman previously and will consider making an appointment to follow-up  Return in 6 months to see me in follow-up

## 2014-06-11 ENCOUNTER — Ambulatory Visit: Payer: PRIVATE HEALTH INSURANCE | Admitting: Internal Medicine

## 2014-09-04 ENCOUNTER — Other Ambulatory Visit: Payer: Self-pay

## 2014-09-04 DIAGNOSIS — Z1231 Encounter for screening mammogram for malignant neoplasm of breast: Secondary | ICD-10-CM

## 2014-10-02 ENCOUNTER — Ambulatory Visit: Payer: PRIVATE HEALTH INSURANCE

## 2014-10-04 ENCOUNTER — Ambulatory Visit
Admission: RE | Admit: 2014-10-04 | Discharge: 2014-10-04 | Disposition: A | Discharge status: Home / Self Care | Payer: PRIVATE HEALTH INSURANCE | Source: Ambulatory Visit

## 2014-10-04 DIAGNOSIS — Z1231 Encounter for screening mammogram for malignant neoplasm of breast: Secondary | ICD-10-CM

## 2014-11-28 ENCOUNTER — Ambulatory Visit (INDEPENDENT_AMBULATORY_CARE_PROVIDER_SITE_OTHER): Payer: PRIVATE HEALTH INSURANCE | Admitting: Internal Medicine

## 2014-11-28 ENCOUNTER — Encounter: Payer: Self-pay | Admitting: Internal Medicine

## 2014-11-28 VITALS — BP 92/74 | HR 87 | Ht 66.0 in | Wt 143.2 lb

## 2014-11-28 DIAGNOSIS — R0683 Snoring: Secondary | ICD-10-CM | POA: Diagnosis not present

## 2014-11-28 DIAGNOSIS — I480 Paroxysmal atrial fibrillation: Secondary | ICD-10-CM | POA: Diagnosis not present

## 2014-11-28 DIAGNOSIS — R002 Palpitations: Secondary | ICD-10-CM

## 2014-11-28 NOTE — Progress Notes (Signed)
PCP:  Lovenia Kim, MD  The patient presents today for routine electrophysiology followup.  Since last being seen in our clinic, the patient reports doing very well.  She has afib rarely and always at night, lasting up to 1-2 hours.  She says that when working out at [employer], she has been told that her heart rates are >220 bpm frequently.  Today, she denies symptoms of chest pain, shortness of breath, orthopnea, PND, lower extremity edema, dizziness, presyncope, syncope, or neurologic sequela.  The patient feels that she is tolerating medications without difficulties and is otherwise without complaint today.   Past Medical History  Diagnosis Date  . OCD (obsessive compulsive disorder)   . Insomnia   . Anxiety   . Depression   . Atrial fibrillation     adm 01/2014  . Atrial fibrillation with RVR   . SVT (supraventricular tachycardia)    Past Surgical History  Procedure Laterality Date  . Diagnostic laparoscopy    . Cesarean section  2007  . Appendectomy    . Laparoscopy  10/30/2011    Procedure: LAPAROSCOPY DIAGNOSTIC;  Surgeon: Lovenia Kim, MD;  Location: [facility];  Service: Gynecology;  Laterality: N/A;    Current Outpatient Prescriptions  Medication Sig Dispense Refill  . BETAINE PO Take 1-2 tablets by mouth with each meal or shake    . buPROPion (WELLBUTRIN XL) 300 MG 24 hr tablet Take 300 mg by mouth daily.    Marland Kitchen CINNAMON PO Take 1/2 teaspoon by mouth daily    . Coenzyme Q10 (CO Q 10 PO) Co Q MAX Take 1 tablet by mouth every other day    . diltiazem (CARDIZEM) 30 MG tablet Take 30 mg by mouth daily as needed (AFIB).    Marland Kitchen EVENING PRIMROSE OIL PO Take 1,300 mg by mouth 2 (two) times daily.    Marland Kitchen GLUCOPHAGE 500 MG tablet Take 500 mg by mouth 2 (two) times daily.  2  . ibuprofen (ADVIL,MOTRIN) 200 MG tablet Take 800 mg by mouth every 6 (six) hours as needed for headache.     . liothyronine (CYTOMEL) 5 MCG tablet Take 10 mcg by mouth 2 (two) times daily.    . Multiple Vitamin  (MULITIVITAMIN WITH MINERALS) TABS Take 1 tablet by mouth at bedtime. Perfect Fit MultiVitamin    . NON FORMULARY RelaxMax Take 1 scoop by mouth at bedtime and daily as needed for relaxation    . NON FORMULARY SeroTrex L-Theanine Take 2 tablets by mouth daily as needed for supplement    . NON FORMULARY OptiMag Neuro Take 1/2 tsp powder scoop by mouth twice daily    . NON FORMULARY Phosphatidylcholine Take 3 tablets by mouth daily    . NON FORMULARY Phosphotidyl serine Take 2 tablets by mouth daily    . NON FORMULARY Omega Marine Oil Liquid Take 1 tsp by mouth daily    . NON FORMULARY Chromate GTF chromium polynicotinate 600 mcg Take 1 tablet by mouth twice a day    . NON FORMULARY Celtic Sea Salt Take 1/8 tsp by mouth daily    . NON FORMULARY Unique E 400 IU Take 1 tablet by mouth daily with food in AM    . NON FORMULARY There Biotic Detoxification Support, 50 bIL - Tale one tablet by mouth daily    . NON FORMULARY Sacro B Take 1 tablet by mouth twice daily    . Nutritional Supplements (DHEA PO) Take 2 mg by mouth daily.    Marland Kitchen  Progesterone Micronized (PROGESTERONE PO) Take 100 mg by mouth cycle days 15-28    . QUEtiapine (SEROQUEL) 25 MG tablet Take 12.5 mg by mouth at bedtime as needed (sleep).     . vitamin A 25000 UNIT capsule Take 25,000 Units by mouth daily.    . Vitamin D, Ergocalciferol, (DRISDOL) 50000 UNITS CAPS capsule Take 50,000 Units by mouth as directed. Take every 5 days    . Vortioxetine HBr (BRINTELLIX) 10 MG TABS Take 1 tablet by mouth daily.    Marland Kitchen ZINC GLUCONATE PO Take 20 mg by mouth 2 (two) times daily.     No current facility-administered medications for this visit.    No Known Allergies  History   Social History  . Marital Status: Married    Spouse Name: N/A  . Number of Children: N/A  . Years of Education: N/A   Occupational History  . Not on file.   Social History Main Topics  . Smoking status: Never Smoker   . Smokeless tobacco: Not on file  . Alcohol  Use: Yes     Comment: occasionally  . Drug Use: No  . Sexual Activity: Not on file   Other Topics Concern  . Not on file   Social History Narrative    Family History  Problem Relation Age of Onset  . COPD Mother   . Heart attack Father     ROS-  All systems are reviewed and are negative except as outlined in the HPI above  Physical Exam: Filed Vitals:   11/28/14 0947  BP: 92/74  Pulse: 87  Height: 5\' 6"  (1.676 m)  Weight: 143 lb 3.2 oz (64.955 kg)    GEN- The patient is well appearing, alert and oriented x 3 today.   Head- normocephalic, atraumatic Eyes-  Sclera clear, conjunctiva pink Ears- hearing intact Oropharynx- clear Neck- supple, no JVP Lymph- no cervical lymphadenopathy Lungs- Clear to ausculation bilaterally, normal work of breathing Heart- Regular rate and rhythm, no murmurs, rubs or gallops, PMI not laterally displaced GI- soft, NT, ND, + BS Extremities- no clubbing, cyanosis, or edema MS- no significant deformity or atrophy Skin- no rash or lesion Psych- euthymic mood, full affect Neuro- strength and sensation are intact    Assessment and Plan:  Assessment and Plan: 1.  Paroxysmal atrial fibrillation stable If she has worsening afib, we should consider flecainide 50mg  BID.  She would like to avoid this presently. Her chads2vasc score is 1 (female).  I have therefore not initiated anticoagulation today. ETOH moderation is encouarged IF she fails medical therapy then we could consider ablation. Given tachypalpitations and concernes of excessively high heart rates with exercise, I will obtain a GXT to better characterize any exertional arrhythmias  2. Snoring She does not have obesity or sleep apnea We discussed ENT referral today.  She has seen Dr Redmond Baseman previously.  She does not feel that she needs to see him at this time.  3. Reports hyponatremia, diabetes, and vit D deficiency.  I have encouraged endocrine referral which she will discuss with  primary care.  Return in 12 months to see me in follow-up

## 2014-11-28 NOTE — Patient Instructions (Addendum)
Your physician has requested that you have an exercise tolerance test. For further information please visit [email or web address]. Please also follow instruction sheet, as given.  Your physician recommends that you continue on your current medications as directed. Please refer to the Current Medication list given to you today.  Your physician wants you to follow-up in: 1 year with Dr. Rayann Heman. You will receive a reminder letter in the mail two months in advance. If you don't receive a letter, please call our office to schedule the follow-up appointment.

## 2014-12-11 ENCOUNTER — Ambulatory Visit (HOSPITAL_COMMUNITY)
Admission: RE | Admit: 2014-12-11 | Discharge: 2014-12-11 | Disposition: A | Discharge status: Home / Self Care | Payer: PRIVATE HEALTH INSURANCE | Source: Ambulatory Visit | Attending: Nurse Practitioner | Admitting: Nurse Practitioner

## 2014-12-11 ENCOUNTER — Encounter (HOSPITAL_COMMUNITY): Payer: Self-pay | Admitting: Nurse Practitioner

## 2014-12-11 ENCOUNTER — Telehealth: Payer: Self-pay | Admitting: Internal Medicine

## 2014-12-11 ENCOUNTER — Other Ambulatory Visit: Payer: Self-pay

## 2014-12-11 VITALS — BP 118/82 | HR 94 | Ht 65.5 in | Wt 145.8 lb

## 2014-12-11 DIAGNOSIS — R008 Other abnormalities of heart beat: Secondary | ICD-10-CM | POA: Diagnosis present

## 2014-12-11 DIAGNOSIS — I480 Paroxysmal atrial fibrillation: Secondary | ICD-10-CM

## 2014-12-11 DIAGNOSIS — R000 Tachycardia, unspecified: Secondary | ICD-10-CM | POA: Insufficient documentation

## 2014-12-11 NOTE — Telephone Encounter (Signed)
New message      Pt thinks she is in AFIB.  Her pulse is 120.  Pt took her medication at approx 8am----diltiazem.  Please advise

## 2014-12-11 NOTE — Progress Notes (Signed)
Patient ID: Volney American, female   DOB: 1971-07-25, 44 y.o.   MRN: [id number]     PCP:  Lovenia Kim, MD  The patient presents today for urgent evaluation in the afib clinic.  Pt was seen by Dr. Rayann Heman in first of April and was reporting rare episodes of afib . She developed a very fast heart rate at work with HR of 180 on a pulse ox . She took a cardizem 30 mg without relief. It continued during the day and she developed left arm, shoulder, jaw pain. She said these symptoms were present with her first episode of afib with v rates around 200. No triggering event identified. She has tried daily cardizem in the past and did not tolerated side effects of drug.  She says that when working out at [employer], she has been told that her heart rates are >220 bpm frequently.  EKG in the office now shows SR with a heart rate of 94 bpm. Left arm pain is easing.  Discussed her options of controlling episodes with  flecainide per Dr. Jackalyn Lombard last note, vrs ablation. She doesn't really want to do either but is leaning toward ablation  because this is now occurring around once a month with very high v rates and is very symptomatic. He husband who is a Armed forces logistics/support/administrative officer at [employer] is also present and answered their questions re the procedure. She also has a chadsvasc score is now 2 for female and DM, which is a new dx in the last few months. Both husband and wife do not want to start blood thinners at this time.   Today, she denies symptoms of  orthopnea, PND, lower extremity edema, dizziness, presyncope, syncope, or neurologic sequela. Positive for leftarm/jaw pain, nervousness with high heart rate.    Past Medical History  Diagnosis Date  . OCD (obsessive compulsive disorder)   . Insomnia   . Anxiety   . Depression   . Atrial fibrillation     adm 01/2014  . Atrial fibrillation with RVR   . SVT (supraventricular tachycardia)    Past Surgical History  Procedure Laterality Date  . Diagnostic laparoscopy    .  Cesarean section  2007  . Appendectomy    . Laparoscopy  10/30/2011    Procedure: LAPAROSCOPY DIAGNOSTIC;  Surgeon: Lovenia Kim, MD;  Location: [facility];  Service: Gynecology;  Laterality: N/A;    Current Outpatient Prescriptions  Medication Sig Dispense Refill  . BETAINE PO Take 1-2 tablets by mouth with each meal or shake    . buPROPion (WELLBUTRIN XL) 300 MG 24 hr tablet Take 300 mg by mouth daily.    Marland Kitchen CINNAMON PO Take 1/2 teaspoon by mouth daily    . Coenzyme Q10 (CO Q 10 PO) Co Q MAX Take 1 tablet by mouth every other day    . diltiazem (CARDIZEM) 30 MG tablet Take 30 mg by mouth daily as needed (AFIB).    Marland Kitchen EVENING PRIMROSE OIL PO Take 1,300 mg by mouth 2 (two) times daily.    Marland Kitchen GLUCOPHAGE 500 MG tablet Take 500 mg by mouth 2 (two) times daily.  2  . ibuprofen (ADVIL,MOTRIN) 200 MG tablet Take 800 mg by mouth every 6 (six) hours as needed for headache.     . liothyronine (CYTOMEL) 5 MCG tablet Take 10 mcg by mouth 2 (two) times daily.    . Multiple Vitamin (MULITIVITAMIN WITH MINERALS) TABS Take 1 tablet by mouth at bedtime. Perfect Fit  MultiVitamin    . NON FORMULARY RelaxMax Take 1 scoop by mouth at bedtime and daily as needed for relaxation    . NON FORMULARY SeroTrex L-Theanine Take 2 tablets by mouth daily as needed for supplement    . NON FORMULARY OptiMag Neuro Take 1/2 tsp powder scoop by mouth twice daily    . NON FORMULARY Phosphatidylcholine Take 3 tablets by mouth daily    . NON FORMULARY Phosphotidyl serine Take 2 tablets by mouth daily    . NON FORMULARY Omega Marine Oil Liquid Take 1 tsp by mouth daily    . NON FORMULARY Chromate GTF chromium polynicotinate 600 mcg Take 1 tablet by mouth twice a day    . NON FORMULARY Celtic Sea Salt Take 1/8 tsp by mouth daily    . NON FORMULARY Unique E 400 IU Take 1 tablet by mouth daily with food in AM    . NON FORMULARY There Biotic Detoxification Support, 50 bIL - Tale one tablet by mouth daily    . NON FORMULARY Sacro B Take  1 tablet by mouth twice daily    . Nutritional Supplements (DHEA PO) Take 2 mg by mouth daily.    . Progesterone Micronized (PROGESTERONE PO) Take 100 mg by mouth cycle days 15-28    . QUEtiapine (SEROQUEL) 25 MG tablet Take 12.5 mg by mouth at bedtime as needed (sleep).     . vitamin A 25000 UNIT capsule Take 25,000 Units by mouth daily.    . Vitamin D, Ergocalciferol, (DRISDOL) 50000 UNITS CAPS capsule Take 50,000 Units by mouth as directed. Take every 5 days    . Vortioxetine HBr (BRINTELLIX) 10 MG TABS Take 1 tablet by mouth daily.    Marland Kitchen ZINC GLUCONATE PO Take 20 mg by mouth 2 (two) times daily.     No current facility-administered medications for this encounter.    No Known Allergies  History   Social History  . Marital Status: Married    Spouse Name: N/A  . Number of Children: N/A  . Years of Education: N/A   Occupational History  . Not on file.   Social History Main Topics  . Smoking status: Never Smoker   . Smokeless tobacco: Not on file  . Alcohol Use: Yes     Comment: occasionally  . Drug Use: No  . Sexual Activity: Not on file   Other Topics Concern  . Not on file   Social History Narrative    Family History  Problem Relation Age of Onset  . COPD Mother   . Heart attack Father     ROS-  All systems are reviewed and are negative except as outlined in the HPI above  Physical Exam: Filed Vitals:   12/11/14 1305  BP: 118/82  Pulse: 94  Height: 5' 5.5" (1.664 m)  Weight: 145 lb 12.8 oz (66.134 kg)    GEN- The patient is well appearing, alert and oriented x 3 today.   Head- normocephalic, atraumatic Eyes-  Sclera clear, conjunctiva pink Ears- hearing intact Oropharynx- clear Neck- supple, no JVP Lymph- no cervical lymphadenopathy Lungs- Clear to ausculation bilaterally, normal work of breathing Heart- Regular rate and rhythm, no murmurs, rubs or gallops, PMI not laterally displaced GI- soft, NT, ND, + BS Extremities- no clubbing, cyanosis, or  edema MS- no significant deformity or atrophy Skin- no rash or lesion Psych- euthymic mood, full affect Neuro- strength and sensation are intact   EKG- NSR at 94 bpm.  Assessment and Plan:  Assessment and Plan: 1.  Paroxysmal atrial fibrillation She defers AAD at this time.  Continue S.A. Diltiazem but can repeat in 3-4 hours if high heart rate is sustained.  Did not tolerated daily Cardizem.  2. Snoring She does not have obesity or sleep apnea  3. Cha2ds2vaSc score of 2(female/DM) Both she and husband were informed of stroke risk and respectfully decline today.  They will further discuss PVI and will help arrange f/u with Dr. Rayann Heman if wanting to pursue.

## 2014-12-11 NOTE — Telephone Encounter (Signed)
Pt called because she think she is in A-fib since 8:00 this morning. Pt states that her heart rate has been erotic up and down 120, 185 to 35 beats/minute. Pt has some left shoulder pain, SOB and a metallic taste in her mouth. 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 Pt states last summer she had to go to the hospital, because of the same problem, so she does not want this problem to get so bad  to go to that. The arrhythmia clinic was called and talked to [facility]. Pt will be seen at 3:00 Pm. Pt is aware .

## 2014-12-12 NOTE — Addendum Note (Signed)
Encounter addended by: Yehuda Mao on: 12/12/2014  8:55 AM<BR>     Documentation filed: Charges VN

## 2014-12-12 NOTE — Addendum Note (Signed)
Encounter addended by: Yehuda Mao on: 12/12/2014  8:52 AM<BR>     Documentation filed: Charges VN

## 2014-12-18 ENCOUNTER — Telehealth (HOSPITAL_COMMUNITY): Payer: Self-pay | Admitting: *Deleted

## 2014-12-18 NOTE — Telephone Encounter (Signed)
Patient called in saying she is ready to make an appointment with Dr. Rayann Heman to discuss ablation.  I told patient I will forward information to the scheduler and she will call with available dates. Patient was appreciative and will be expecting call.

## 2015-01-03 ENCOUNTER — Ambulatory Visit (INDEPENDENT_AMBULATORY_CARE_PROVIDER_SITE_OTHER): Payer: PRIVATE HEALTH INSURANCE

## 2015-01-03 ENCOUNTER — Encounter: Payer: PRIVATE HEALTH INSURANCE | Admitting: Physician Assistant

## 2015-01-03 DIAGNOSIS — I480 Paroxysmal atrial fibrillation: Secondary | ICD-10-CM

## 2015-01-03 LAB — EXERCISE TOLERANCE TEST
CHL CUP RESTING HR STRESS: 92 {beats}/min
CHL CUP STRESS STAGE 1 HR: 101 {beats}/min
CHL CUP STRESS STAGE 1 SPEED: 0 mph
CHL CUP STRESS STAGE 2 GRADE: 0 %
CHL CUP STRESS STAGE 5 HR: 151 {beats}/min
CHL CUP STRESS STAGE 5 SPEED: 2.5 mph
CHL CUP STRESS STAGE 6 GRADE: 14 %
CHL CUP STRESS STAGE 6 HR: 184 {beats}/min
CHL CUP STRESS STAGE 6 SPEED: 3.4 mph
CHL CUP STRESS STAGE 7 SBP: 109 mmHg
CHL CUP STRESS STAGE 8 DBP: 68 mmHg
CHL CUP STRESS STAGE 8 SPEED: 0 mph
CSEPED: 8 min
CSEPEW: 10.1 METS
CSEPHR: 103 %
Exercise duration (sec): 30 s
MPHR: 178 {beats}/min
Peak HR: 184 {beats}/min
Percent of predicted max HR: 103 %
RPE: 17
Stage 1 DBP: 81 mmHg
Stage 1 Grade: 0 %
Stage 1 SBP: 119 mmHg
Stage 2 HR: 107 {beats}/min
Stage 2 Speed: 1 mph
Stage 3 Grade: 0 %
Stage 3 HR: 107 {beats}/min
Stage 3 Speed: 1 mph
Stage 4 DBP: 65 mmHg
Stage 4 Grade: 10 %
Stage 4 HR: 112 {beats}/min
Stage 4 SBP: 100 mmHg
Stage 4 Speed: 1.7 mph
Stage 5 DBP: 66 mmHg
Stage 5 Grade: 12 %
Stage 5 SBP: 120 mmHg
Stage 7 DBP: 65 mmHg
Stage 7 Grade: 0 %
Stage 7 HR: 164 {beats}/min
Stage 7 Speed: 0 mph
Stage 8 Grade: 0 %
Stage 8 HR: 111 {beats}/min
Stage 8 SBP: 91 mmHg

## 2015-02-11 ENCOUNTER — Encounter: Payer: Self-pay | Admitting: Internal Medicine

## 2015-02-11 ENCOUNTER — Ambulatory Visit (INDEPENDENT_AMBULATORY_CARE_PROVIDER_SITE_OTHER): Payer: PRIVATE HEALTH INSURANCE | Admitting: Internal Medicine

## 2015-02-11 VITALS — BP 92/66 | HR 74 | Ht 65.5 in | Wt 147.0 lb

## 2015-02-11 DIAGNOSIS — R0683 Snoring: Secondary | ICD-10-CM

## 2015-02-11 DIAGNOSIS — I480 Paroxysmal atrial fibrillation: Secondary | ICD-10-CM | POA: Diagnosis not present

## 2015-02-11 MED ORDER — RIVAROXABAN 20 MG PO TABS: 20.0000 mg | ORAL_TABLET | Freq: Every day | ORAL | Status: DC

## 2015-02-11 MED ORDER — FLECAINIDE ACETATE 100 MG PO TABS: ORAL_TABLET | ORAL | Status: DC

## 2015-02-11 NOTE — Patient Instructions (Signed)
Medication Instructions:  Your physician has recommended you make the following change in your medication:  1) Take Flecainide 100mg ----take 1-3 pills as needed at the onset on fast heart rates 2) Start Xarelto 20 mg daily on 03/04/15 (3 weeks prior to ablation)   Labwork: None today  Your physician recommends that you return for lab work on 03/18/15 ---pre- procedure(do not have to fast)   Testing/Procedures: Your physician has recommended that you have an ablation. Catheter ablation is a medical procedure used to treat some cardiac arrhythmias (irregular heartbeats). During catheter ablation, a long, thin, flexible tube is put into a blood vessel in your groin (upper thigh), or neck. This tube is called an ablation catheter. It is then guided to your heart through the blood vessel. Radio frequency waves destroy small areas of heart tissue where abnormal heartbeats may cause an arrhythmia to start. Please see the instruction sheet given to you today.  Ablation will be on 03/26/15 at 7:30am Will need to be at the hospital at 5:30am.  TEE-- will be on 03/25/15 ---I will let you know time and give you an instruction sheet when you come for labs on 03/18/15    Follow-Up: Your physician recommends that you schedule a follow-up appointment in: 4 weeks from 03/26/15 with Roderic Palau, NP in [place] [facility] and 3 months from 03/26/15 with Dr Rayann Heman    Any Other Special Instructions Will Be Listed Below (If Applicable).  Endocrinologist---Dr Buddy Duty or Dr Debbora Presto

## 2015-02-12 NOTE — Progress Notes (Signed)
PCP:  Lovenia Kim, MD  The patient presents today for routine electrophysiology followup.  Since last being seen in our clinic, the patient reports doing very well.  She has afib rarely and always at night, lasting up to 1-2 hours.  She says that when working out at [employer], she has been told that her heart rates are >220 bpm frequently.  She recently had an exercise treadmill test in our office during which she did not have any exercise induced arrhythmias. Today, she denies symptoms of chest pain, shortness of breath, orthopnea, PND, lower extremity edema, dizziness, presyncope, syncope, or neurologic sequela.  The patient feels that she is tolerating medications without difficulties and is otherwise without complaint today.   Past Medical History  Diagnosis Date  . OCD (obsessive compulsive disorder)   . Insomnia   . Anxiety   . Depression   . Atrial fibrillation     adm 01/2014  . Atrial fibrillation with RVR   . SVT (supraventricular tachycardia)    Past Surgical History  Procedure Laterality Date  . Diagnostic laparoscopy    . Cesarean section  2007  . Appendectomy    . Laparoscopy  10/30/2011    Procedure: LAPAROSCOPY DIAGNOSTIC;  Surgeon: Lovenia Kim, MD;  Location: [facility];  Service: Gynecology;  Laterality: N/A;    Current Outpatient Prescriptions  Medication Sig Dispense Refill  . BETAINE PO Take 1-2 tablets by mouth with each meal or shake    . buPROPion (WELLBUTRIN XL) 300 MG 24 hr tablet Take 300 mg by mouth daily.    Marland Kitchen CINNAMON PO Take 1/2 teaspoon by mouth daily    . Coenzyme Q10 (CO Q 10 PO) Co Q MAX Take 1 tablet by mouth every other day    . diltiazem (CARDIZEM) 30 MG tablet Take 30 mg by mouth daily as needed (AFIB).    Marland Kitchen EVENING PRIMROSE OIL PO Take 1,300 mg by mouth 2 (two) times daily.    . flecainide (TAMBOCOR) 100 MG tablet Take 1-3 tablets by mouth as needed at onset of fast heart rate(afib) 12 tablet 0  . GLUCOPHAGE 500 MG tablet Take 500 mg by  mouth 2 (two) times daily.  2  . ibuprofen (ADVIL,MOTRIN) 200 MG tablet Take 800 mg by mouth every 6 (six) hours as needed for headache.     . liothyronine (CYTOMEL) 5 MCG tablet Take 10 mcg by mouth 2 (two) times daily.    . Multiple Vitamin (MULITIVITAMIN WITH MINERALS) TABS Take 1 tablet by mouth at bedtime. Perfect Fit MultiVitamin    . NON FORMULARY RelaxMax Take 1 scoop by mouth at bedtime and daily as needed for relaxation    . NON FORMULARY SeroTrex L-Theanine Take 2 tablets by mouth daily as needed for supplement    . NON FORMULARY OptiMag Neuro Take 1/2 tsp powder scoop by mouth twice daily    . NON FORMULARY Phosphatidylcholine Take 3 tablets by mouth daily    . NON FORMULARY Phosphotidyl serine Take 2 tablets by mouth daily    . NON FORMULARY Omega Marine Oil Liquid Take 1 tsp by mouth daily    . NON FORMULARY Chromate GTF chromium polynicotinate 600 mcg Take 1 tablet by mouth twice a day    . NON FORMULARY Celtic Sea Salt Take 1/8 tsp by mouth daily    . NON FORMULARY Unique E 400 IU Take 1 tablet by mouth daily with food in AM    . NON FORMULARY There  Biotic Detoxification Support, [place] one tablet by mouth daily    . NON FORMULARY Sacro B Take 1 tablet by mouth twice daily    . Nutritional Supplements (DHEA PO) Take 2 mg by mouth daily.    . Progesterone Micronized (PROGESTERONE PO) Take 100 mg by mouth cycle days 15-28    . QUEtiapine (SEROQUEL) 25 MG tablet Take 12.5 mg by mouth at bedtime as needed (sleep).     . rivaroxaban (XARELTO) 20 MG TABS tablet Take 1 tablet (20 mg total) by mouth daily with supper. 30 tablet 11  . vitamin A 25000 UNIT capsule Take 25,000 Units by mouth daily.    . Vitamin D, Ergocalciferol, (DRISDOL) 50000 UNITS CAPS capsule Take 50,000 Units by mouth as directed. Take every 5 days    . Vortioxetine HBr (BRINTELLIX) 10 MG TABS Take 1 tablet by mouth daily.    Marland Kitchen ZINC GLUCONATE PO Take 20 mg by mouth 2 (two) times daily.     No current  facility-administered medications for this visit.    No Known Allergies  History   Social History  . Marital Status: Married    Spouse Name: N/A  . Number of Children: N/A  . Years of Education: N/A   Occupational History  . Not on file.   Social History Main Topics  . Smoking status: Never Smoker   . Smokeless tobacco: Not on file  . Alcohol Use: Yes     Comment: occasionally  . Drug Use: No  . Sexual Activity: Not on file   Other Topics Concern  . Not on file   Social History Narrative    Family History  Problem Relation Age of Onset  . COPD Mother   . Heart attack Father     ROS-  All systems are reviewed and are negative except as outlined in the HPI above  Physical Exam: Filed Vitals:   02/11/15 0924  BP: 92/66  Pulse: 74  Height: 5' 5.5" (1.664 m)  Weight: 66.679 kg (147 lb)    GEN- The patient is well appearing, alert and oriented x 3 today.   Head- normocephalic, atraumatic Eyes-  Sclera clear, conjunctiva pink Ears- hearing intact Oropharynx- clear Neck- supple,  Lungs- Clear to ausculation bilaterally, normal work of breathing Heart- Regular rate and rhythm, no murmurs, rubs or gallops, PMI not laterally displaced GI- soft, NT, ND, + BS Extremities- no clubbing, cyanosis, or edema MS- no significant deformity or atrophy Skin- no rash or lesion Psych- euthymic mood, full affect Neuro- strength and sensation are intact   ekg reveals sinus rhythm  Assessment and Plan: 1.  Paroxysmal atrial fibrillation She continues to have intermittent afib with RVR.   Therapeutic strategies for afib including medicine and ablation were discussed in detail with the patient and her spouse today today. Risk, benefits, and alternatives to EP study and radiofrequency ablation for afib were also discussed in detail today. These risks include but are not limited to stroke, bleeding, vascular damage, tamponade, perforation, damage to the esophagus, lungs, and other  structures, pulmonary vein stenosis, worsening renal function, and death. The patient understands these risk and wishes to proceed.  We will therefore proceed with catheter ablation once the patient has been adequately anticoagulated.  Start xarelto 20mg  daily.  She is given flecainide which she can use as a "pill in pocket" medicine in combination with cardizem which she presently uses as needed.  Will be important to look for accessory pathways during  EP study given her very rapid tachyarrhythmias.   Her chads2vasc score is 1 (female)  ETOH moderation is encouarged  2. Snoring She does not have obesity or sleep apnea Stable No change required today  3. Reports hyponatremia, diabetes, and vit D deficiency.  I have encouraged endocrine referral which she will discuss with primary care.

## 2015-02-20 ENCOUNTER — Other Ambulatory Visit: Payer: Self-pay | Admitting: *Deleted

## 2015-02-20 DIAGNOSIS — I480 Paroxysmal atrial fibrillation: Secondary | ICD-10-CM

## 2015-03-12 ENCOUNTER — Other Ambulatory Visit: Payer: Self-pay | Admitting: *Deleted

## 2015-03-12 ENCOUNTER — Encounter: Payer: Self-pay | Admitting: *Deleted

## 2015-03-12 DIAGNOSIS — I480 Paroxysmal atrial fibrillation: Secondary | ICD-10-CM

## 2015-03-14 ENCOUNTER — Encounter (HOSPITAL_COMMUNITY): Payer: Self-pay | Admitting: Pharmacy Technician

## 2015-03-15 ENCOUNTER — Other Ambulatory Visit (INDEPENDENT_AMBULATORY_CARE_PROVIDER_SITE_OTHER): Payer: PRIVATE HEALTH INSURANCE | Admitting: *Deleted

## 2015-03-15 DIAGNOSIS — I480 Paroxysmal atrial fibrillation: Secondary | ICD-10-CM | POA: Diagnosis not present

## 2015-03-15 LAB — CBC WITH DIFFERENTIAL/PLATELET
Basophils Absolute: 0 10*3/uL (ref 0.0–0.1)
Basophils Relative: 0.7 % (ref 0.0–3.0)
Eosinophils Absolute: 0.4 10*3/uL (ref 0.0–0.7)
Eosinophils Relative: 5.9 % — ABNORMAL HIGH (ref 0.0–5.0)
HCT: 40.7 % (ref 36.0–46.0)
Hemoglobin: 13.5 g/dL (ref 12.0–15.0)
LYMPHS ABS: 2.3 10*3/uL (ref 0.7–4.0)
Lymphocytes Relative: 35.2 % (ref 12.0–46.0)
MCHC: 33.2 g/dL (ref 30.0–36.0)
MCV: 90.1 fl (ref 78.0–100.0)
Monocytes Absolute: 0.7 10*3/uL (ref 0.1–1.0)
Monocytes Relative: 10.7 % (ref 3.0–12.0)
Neutro Abs: 3.1 10*3/uL (ref 1.4–7.7)
Neutrophils Relative %: 47.5 % (ref 43.0–77.0)
Platelets: 224 10*3/uL (ref 150.0–400.0)
RBC: 4.52 Mil/uL (ref 3.87–5.11)
RDW: 13.5 % (ref 11.5–15.5)
WBC: 6.5 10*3/uL (ref 4.0–10.5)

## 2015-03-15 LAB — BASIC METABOLIC PANEL
BUN: 16 mg/dL (ref 6–23)
CO2: 29 mEq/L (ref 19–32)
Calcium: 9.2 mg/dL (ref 8.4–10.5)
Chloride: 103 mEq/L (ref 96–112)
Creatinine, Ser: 0.74 mg/dL (ref 0.40–1.20)
GFR: 91.04 mL/min (ref >60.00)
GLUCOSE: 86 mg/dL (ref 70–99)
POTASSIUM: 4 meq/L (ref 3.5–5.1)
Sodium: 137 mEq/L (ref 135–145)

## 2015-03-15 NOTE — Addendum Note (Signed)
Addended by: Eulis Foster on: 03/15/2015 09:08 AM   Modules accepted: Orders

## 2015-03-25 ENCOUNTER — Encounter (HOSPITAL_COMMUNITY): Payer: Self-pay | Admitting: *Deleted

## 2015-03-25 ENCOUNTER — Ambulatory Visit (HOSPITAL_COMMUNITY)
Admission: RE | Admit: 2015-03-25 | Discharge: 2015-03-25 | Disposition: A | Discharge status: Home / Self Care | Payer: PRIVATE HEALTH INSURANCE | Source: Ambulatory Visit | Attending: Cardiology | Admitting: Cardiology

## 2015-03-25 ENCOUNTER — Encounter (HOSPITAL_COMMUNITY)
Admission: RE | Disposition: A | Discharge status: Home / Self Care | Payer: Self-pay | Source: Ambulatory Visit | Attending: Cardiology

## 2015-03-25 ENCOUNTER — Ambulatory Visit (HOSPITAL_BASED_OUTPATIENT_CLINIC_OR_DEPARTMENT_OTHER)
Admission: RE | Admit: 2015-03-25 | Discharge: 2015-03-25 | Disposition: A | Discharge status: Home / Self Care | Payer: PRIVATE HEALTH INSURANCE | Source: Ambulatory Visit | Attending: Internal Medicine | Admitting: Internal Medicine

## 2015-03-25 DIAGNOSIS — I471 Supraventricular tachycardia: Secondary | ICD-10-CM | POA: Diagnosis not present

## 2015-03-25 DIAGNOSIS — F42 Obsessive-compulsive disorder: Secondary | ICD-10-CM | POA: Insufficient documentation

## 2015-03-25 DIAGNOSIS — I4891 Unspecified atrial fibrillation: Secondary | ICD-10-CM | POA: Diagnosis not present

## 2015-03-25 DIAGNOSIS — F419 Anxiety disorder, unspecified: Secondary | ICD-10-CM | POA: Insufficient documentation

## 2015-03-25 DIAGNOSIS — E119 Type 2 diabetes mellitus without complications: Secondary | ICD-10-CM | POA: Diagnosis not present

## 2015-03-25 DIAGNOSIS — F329 Major depressive disorder, single episode, unspecified: Secondary | ICD-10-CM | POA: Insufficient documentation

## 2015-03-25 DIAGNOSIS — I480 Paroxysmal atrial fibrillation: Secondary | ICD-10-CM

## 2015-03-25 HISTORY — DX: Type 2 diabetes mellitus without complications: E11.9

## 2015-03-25 HISTORY — PX: TEE WITHOUT CARDIOVERSION: SHX5443

## 2015-03-25 SURGERY — ECHOCARDIOGRAM, TRANSESOPHAGEAL
Anesthesia: Moderate Sedation

## 2015-03-25 MED ORDER — BUTAMBEN-TETRACAINE-BENZOCAINE 2-2-14 % EX AERO
INHALATION_SPRAY | CUTANEOUS | Status: DC | PRN
  Administered 2015-03-25: 2 via TOPICAL

## 2015-03-25 MED ORDER — SODIUM CHLORIDE 0.9 % IV SOLN: INTRAVENOUS | Status: DC

## 2015-03-25 MED ORDER — MIDAZOLAM HCL 5 MG/ML IJ SOLN
INTRAMUSCULAR | Status: AC
  Filled 2015-03-25: qty 2

## 2015-03-25 MED ORDER — DIPHENHYDRAMINE HCL 50 MG/ML IJ SOLN
INTRAMUSCULAR | Status: AC
  Filled 2015-03-25: qty 1

## 2015-03-25 MED ORDER — MIDAZOLAM HCL 10 MG/2ML IJ SOLN
INTRAMUSCULAR | Status: DC | PRN
  Administered 2015-03-25: 3 mg via INTRAVENOUS
  Administered 2015-03-25 (×2): 2 mg via INTRAVENOUS

## 2015-03-25 MED ORDER — FENTANYL CITRATE (PF) 100 MCG/2ML IJ SOLN
INTRAMUSCULAR | Status: AC
  Filled 2015-03-25: qty 4

## 2015-03-25 MED ORDER — SODIUM CHLORIDE 0.9 % IV SOLN
INTRAVENOUS | Status: DC
  Administered 2015-03-25: 500 mL via INTRAVENOUS

## 2015-03-25 MED ORDER — FENTANYL CITRATE (PF) 100 MCG/2ML IJ SOLN
INTRAMUSCULAR | Status: DC | PRN
  Administered 2015-03-25 (×3): 25 ug via INTRAVENOUS

## 2015-03-25 NOTE — H&P (Signed)
ADMISSION HISTORY AND PHYSICAL   Date: 03/25/2015               Patient Name:  [name] MRN: [id number]  DOB: 03/30/1971 Age / Sex: 44 y.o., female        PCP: Lovenia Kim Primary Cardiologist: Allred         History of Present Illness: Patient is a 44 y.o. female with a PMHx of paroxysmal atrial fib , who was admitted to [facility] on 03/25/2015 for TEE prior to A-fib ablation tomorrow .  The patient has had intermittent atrial fibrillation for the past year so. These typically occur when she is working out. She's very sympathetic. She's tried flecainide with minimal success. She is scheduled for atrial fibrillation ablation tomorrow. She scheduled for transesophageal echo today for further evaluation and to rule out atrial and left atrial appendage thrombi.    Medications: Outpatient medications: Prescriptions prior to admission  Medication Sig Dispense Refill Last Dose  . flecainide (TAMBOCOR) 100 MG tablet Take 1-3 tablets by mouth as needed at onset of fast heart rate(afib) 12 tablet 0 Past Week at Unknown time  . rivaroxaban (XARELTO) 20 MG TABS tablet Take 1 tablet (20 mg total) by mouth daily with supper. 30 tablet 11 03/25/2015 at 1900    No Known Allergies   Past Medical History  Diagnosis Date  . OCD (obsessive compulsive disorder)   . Insomnia   . Anxiety   . Depression   . Atrial fibrillation     adm 01/2014  . Atrial fibrillation with RVR   . SVT (supraventricular tachycardia)   . Diabetes mellitus without complication     controlled with diet    Past Surgical History  Procedure Laterality Date  . Diagnostic laparoscopy    . Cesarean section  2007  . Appendectomy    . Laparoscopy  10/30/2011    Procedure: LAPAROSCOPY DIAGNOSTIC;  Surgeon: Lovenia Kim, MD;  Location: [facility];  Service: Gynecology;  Laterality: N/A;    Family History  Problem Relation Age of Onset  . COPD Mother   . Heart attack Father     Social History:  reports that she  has never smoked. She does not have any smokeless tobacco history on file. She reports that she drinks alcohol. She reports that she does not use illicit drugs.   Review of Systems: Constitutional:  denies fever, chills, diaphoresis, appetite change and fatigue.  HEENT: denies photophobia, eye pain, redness, hearing loss, ear pain, congestion, sore throat, rhinorrhea, sneezing, neck pain, neck stiffness and tinnitus.  Respiratory: denies SOB, DOE, cough, chest tightness, and wheezing.  Cardiovascular: denies chest pain, palpitations and leg swelling.  Gastrointestinal: denies nausea, vomiting, abdominal pain, diarrhea, constipation, blood in stool.  Genitourinary: denies dysuria, urgency, frequency, hematuria, flank pain and difficulty urinating.  Musculoskeletal: denies  myalgias, back pain, joint swelling, arthralgias and gait problem.   Skin: denies pallor, rash and wound.  Neurological: denies dizziness, seizures, syncope, weakness, light-headedness, numbness and headaches.   Hematological: denies adenopathy, easy bruising, personal or family bleeding history.  Psychiatric/ Behavioral: denies suicidal ideation, mood changes, confusion, nervousness, sleep disturbance and agitation.    Physical Exam: BP 96/64 mmHg  Temp(Src) 98 F (36.7 C) (Oral)  Resp 9  Ht 5' 5.5" (1.664 m)  Wt 63.504 kg (140 lb)  BMI 22.93 kg/m2  SpO2 100%  LMP 03/07/2015 (Approximate)  Wt Readings from Last 3 Encounters:  03/25/15 63.504 kg (140 lb)  02/11/15 66.679 kg (147 lb)  12/11/14 66.134 kg (145 lb 12.8 oz)    General: Vital signs reviewed and noted. Well-developed, well-nourished, in no acute distress; alert,   Head: Normocephalic, atraumatic, sclera anicteric, mucus membranes are moist   Neck: Supple. Negative for carotid bruits. JVD not elevated.   Lungs:  Clear bilaterally to auscultation without wheezes, rales, or rhonchi. Breathing is normal   Heart: RRR with S1 S2. No murmurs, rubs, or  gallops.   Abdomen:  Soft, non-tender, non-distended with normoactive bowel sounds. No hepatomegaly. No rebound/guarding. No obvious abdominal masses   MSK: Strength and the appear normal for age.   Extremities: No clubbing or cyanosis. No edema.  Distal pedal pulses are 2+ and equal bilaterally .  Neurologic: Alert and oriented X 3. Moves all extremities spontaneously   Psych:  normal     Lab results: Basic Metabolic Panel: No results for input(s): NA, K, CL, CO2, GLUCOSE, BUN, CREATININE, CALCIUM, MG, PHOS in the last 168 hours.  Liver Function Tests: No results for input(s): AST, ALT, ALKPHOS, BILITOT, PROT, ALBUMIN in the last 168 hours. No results for input(s): LIPASE, AMYLASE in the last 168 hours.  CBC: No results for input(s): WBC, NEUTROABS, HGB, HCT, MCV, PLT in the last 168 hours.  Cardiac Enzymes: No results for input(s): CKTOTAL, CKMB, CKMBINDEX, TROPONINI in the last 168 hours.  BNP: Invalid input(s): POCBNP  CBG: No results for input(s): GLUCAP in the last 168 hours.  Coagulation Studies: No results for input(s): LABPROT, INR in the last 72 hours.   Other results:  EKG :  February 12, 2015:  NSR . Normal ecg     Imaging:  No results found.    Assessment & Plan:  1. Atrial fibrillation: We'll proceed with transesophageal echo today to evaluate her cardiac structure. We discussed the risks, benefits, and options of transesophageal echo. She understands and agrees to proceed.     Thayer Headings, Brooke Bonito., MD, [facility] 03/25/2015, 8:54 AM

## 2015-03-25 NOTE — CV Procedure (Signed)
    Transesophageal Echocardiogram Note  [name] [id number] 10/28/1970  Procedure: Transesophageal Echocardiogram Indications: atrial fib , pre-ablation   Procedure Details Consent: Obtained Time Out: Verified patient identification, verified procedure, site/side was marked, verified correct patient position, special equipment/implants available, Radiology Safety Procedures followed,  medications/allergies/relevent history reviewed, required imaging and test results available.  Performed  Medications: Fentanyl: 75 mcg iv Versed: 7 mg iv   Left Ventrical:  Normal LV function,   Mitral Valve: trivial MR   Aortic Valve: normal   Tricuspid Valve: normal,   Pulmonic Valve: normal   Left Atrium/ Left atrial appendage: normal, no thrombi   Atrial septum: intact,  No PFO by color or bubble contrast  Aorta: normal    Complications: No apparent complications Patient did tolerate procedure well.   Thayer Headings, Brooke Bonito., MD, [facility] 03/25/2015, 9:19 AM

## 2015-03-25 NOTE — Discharge Instructions (Signed)
Transesophageal Echocardiogram °Transesophageal echocardiography (TEE) is a picture test of your heart using sound waves. The pictures taken can give very detailed pictures of your heart. This can help your doctor see if there are problems with your heart. TEE can check: °· If your heart has blood clots in it. °· How well your heart valves are working. °· If you have an infection on the inside of your heart. °· Some of the major arteries of your heart. °· If your heart valve is working after a repair. °· Your heart before a procedure that uses a shock to your heart to get the rhythm back to normal. °BEFORE THE PROCEDURE °· Do not eat or drink for 6 hours before the procedure or as told by your doctor. °· Make plans to have someone drive you home after the procedure. Do not drive yourself home. °· An IV tube will be put in your arm. °PROCEDURE °· You will be given a medicine to help you relax (sedative). It will be given through the IV tube. °· A numbing medicine will be sprayed or gargled in the back of your throat to help numb it. °· The tip of the probe is placed into the back of your mouth. You will be asked to swallow. This helps to pass the probe into your esophagus. °· Once the tip of the probe is in the right place, your doctor can take pictures of your heart. °· You may feel pressure at the back of your throat. °AFTER THE PROCEDURE °· You will be taken to a recovery area so the sedative can wear off. °· Your throat may be sore and scratchy. This will go away slowly over time. °· You will go home when you are fully awake and able to swallow liquids. °· You should have someone stay with you for the next 24 hours. °· Do not drive or operate machinery for the next 24 hours. °Document Released: 06/07/2009 Document Revised: 08/15/2013 Document Reviewed: 02/09/2013 °ExitCare® Patient Information ©2015 [employer]. This information is not intended to replace advice given to you by your health care provider. Make  sure you discuss any questions you have with your health care provider. ° ° °Conscious Sedation, Adult, Care After °Refer to this sheet in the next few weeks. These instructions provide you with information on caring for yourself after your procedure. Your health care provider may also give you more specific instructions. Your treatment has been planned according to current medical practices, but problems sometimes occur. Call your health care provider if you have any problems or questions after your procedure. °WHAT TO EXPECT AFTER THE PROCEDURE  °After your procedure: °· You may feel sleepy, clumsy, and have poor balance for several hours. °· Vomiting may occur if you eat too soon after the procedure. °HOME CARE INSTRUCTIONS °· Do not participate in any activities where you could become injured for at least 24 hours. Do not: °¨ Drive. °¨ Swim. °¨ Ride a bicycle. °¨ Operate heavy machinery. °¨ Cook. °¨ Use power tools. °¨ Climb ladders. °¨ Work from a high place. °· Do not make important decisions or sign legal documents until you are improved. °· If you vomit, drink water, juice, or soup when you can drink without vomiting. Make sure you have little or no nausea before eating solid foods. °· Only take over-the-counter or prescription medicines for pain, discomfort, or fever as directed by your health care provider. °· Make sure you and your family fully understand everything about   the medicines given to you, including what side effects may occur. °· You should not drink alcohol, take sleeping pills, or take medicines that cause drowsiness for at least 24 hours. °· If you smoke, do not smoke without supervision. °· If you are feeling better, you may resume normal activities 24 hours after you were sedated. °· Keep all appointments with your health care provider. °SEEK MEDICAL CARE IF: °· Your skin is pale or bluish in color. °· You continue to feel nauseous or vomit. °· Your pain is getting worse and is not helped  by medicine. °· You have bleeding or swelling. °· You are still sleepy or feeling clumsy after 24 hours. °SEEK IMMEDIATE MEDICAL CARE IF: °· You develop a rash. °· You have difficulty breathing. °· You develop any type of allergic problem. °· You have a fever. °MAKE SURE YOU: °· Understand these instructions. °· Will watch your condition. °· Will get help right away if you are not doing well or get worse. °Document Released: 05/31/2013 Document Reviewed: 05/31/2013 °ExitCare® Patient Information ©2015 [employer]. This information is not intended to replace advice given to you by your health care provider. Make sure you discuss any questions you have with your health care provider. ° °

## 2015-03-25 NOTE — Interval H&P Note (Signed)
History and Physical Interval Note:  03/25/2015 8:58 AM  [name]  has presented today for surgery, with the diagnosis of AFIB  The various methods of treatment have been discussed with the patient and family. After consideration of risks, benefits and other options for treatment, the patient has consented to  Procedure(s): TRANSESOPHAGEAL ECHOCARDIOGRAM (TEE) (N/A) as a surgical intervention .  The patient's history has been reviewed, patient examined, no change in status, stable for surgery.  I have reviewed the patient's chart and labs.  Questions were answered to the patient's satisfaction.     [name], Wonda Cheng

## 2015-03-26 ENCOUNTER — Encounter (HOSPITAL_COMMUNITY)
Admission: RE | Disposition: A | Discharge status: Home / Self Care | Payer: Self-pay | Source: Ambulatory Visit | Attending: Internal Medicine

## 2015-03-26 ENCOUNTER — Ambulatory Visit (HOSPITAL_COMMUNITY): Payer: PRIVATE HEALTH INSURANCE | Admitting: Anesthesiology

## 2015-03-26 ENCOUNTER — Encounter (HOSPITAL_COMMUNITY): Payer: Self-pay | Admitting: Anesthesiology

## 2015-03-26 ENCOUNTER — Ambulatory Visit (HOSPITAL_COMMUNITY)
Admission: RE | Admit: 2015-03-26 | Discharge: 2015-03-27 | Disposition: A | Discharge status: Home / Self Care | Payer: PRIVATE HEALTH INSURANCE | Source: Ambulatory Visit | Attending: Internal Medicine | Admitting: Internal Medicine

## 2015-03-26 DIAGNOSIS — Z7901 Long term (current) use of anticoagulants: Secondary | ICD-10-CM | POA: Insufficient documentation

## 2015-03-26 DIAGNOSIS — Z791 Long term (current) use of non-steroidal anti-inflammatories (NSAID): Secondary | ICD-10-CM | POA: Insufficient documentation

## 2015-03-26 DIAGNOSIS — F329 Major depressive disorder, single episode, unspecified: Secondary | ICD-10-CM | POA: Diagnosis not present

## 2015-03-26 DIAGNOSIS — I4891 Unspecified atrial fibrillation: Secondary | ICD-10-CM | POA: Diagnosis present

## 2015-03-26 DIAGNOSIS — I480 Paroxysmal atrial fibrillation: Secondary | ICD-10-CM | POA: Insufficient documentation

## 2015-03-26 DIAGNOSIS — F419 Anxiety disorder, unspecified: Secondary | ICD-10-CM | POA: Insufficient documentation

## 2015-03-26 DIAGNOSIS — F42 Obsessive-compulsive disorder: Secondary | ICD-10-CM | POA: Diagnosis not present

## 2015-03-26 DIAGNOSIS — G4700 Insomnia, unspecified: Secondary | ICD-10-CM | POA: Diagnosis not present

## 2015-03-26 HISTORY — PX: ELECTROPHYSIOLOGIC STUDY: SHX172A

## 2015-03-26 LAB — MRSA PCR SCREENING: MRSA by PCR: NEGATIVE

## 2015-03-26 LAB — POCT ACTIVATED CLOTTING TIME
ACTIVATED CLOTTING TIME: 140 s
Activated Clotting Time: 350 seconds
Activated Clotting Time: 331 seconds

## 2015-03-26 LAB — PREGNANCY, URINE: Preg Test, Ur: NEGATIVE

## 2015-03-26 LAB — GLUCOSE, CAPILLARY: Glucose-Capillary: 116 mg/dL — ABNORMAL HIGH (ref 65–99)

## 2015-03-26 SURGERY — ATRIAL FIBRILLATION ABLATION
Anesthesia: General

## 2015-03-26 MED ORDER — BUPIVACAINE HCL (PF) 0.25 % IJ SOLN
INTRAMUSCULAR | Status: AC
  Filled 2015-03-26: qty 30

## 2015-03-26 MED ORDER — FENTANYL CITRATE (PF) 100 MCG/2ML IJ SOLN
INTRAMUSCULAR | Status: DC | PRN
  Administered 2015-03-26: 25 ug via INTRAVENOUS
  Administered 2015-03-26: 50 ug via INTRAVENOUS
  Administered 2015-03-26 (×2): 25 ug via INTRAVENOUS

## 2015-03-26 MED ORDER — SODIUM CHLORIDE 0.9 % IV SOLN
INTRAVENOUS | Status: DC
  Administered 2015-03-26: 07:00:00 via INTRAVENOUS

## 2015-03-26 MED ORDER — ONDANSETRON HCL 4 MG/2ML IJ SOLN
INTRAMUSCULAR | Status: DC | PRN
  Administered 2015-03-26: 4 mg via INTRAVENOUS

## 2015-03-26 MED ORDER — HEPARIN SODIUM (PORCINE) 1000 UNIT/ML IJ SOLN
INTRAMUSCULAR | Status: AC
Start: 2015-03-26 — End: 2015-03-26
  Filled 2015-03-26: qty 1

## 2015-03-26 MED ORDER — PHENYLEPHRINE HCL 10 MG/ML IJ SOLN
INTRAMUSCULAR | Status: DC | PRN
  Administered 2015-03-26: 80 ug via INTRAVENOUS

## 2015-03-26 MED ORDER — ONDANSETRON HCL 4 MG/2ML IJ SOLN
4.0000 mg | Freq: Four times a day (QID) | INTRAMUSCULAR | Status: DC | PRN
  Administered 2015-03-26: 4 mg via INTRAVENOUS
  Filled 2015-03-26: qty 2

## 2015-03-26 MED ORDER — SODIUM CHLORIDE 0.9 % IV SOLN
10.0000 mg | INTRAVENOUS | Status: DC | PRN
  Administered 2015-03-26: 5 ug/min via INTRAVENOUS

## 2015-03-26 MED ORDER — SODIUM CHLORIDE 0.9 % IJ SOLN: 3.0000 mL | INTRAMUSCULAR | Status: DC | PRN

## 2015-03-26 MED ORDER — PROPOFOL INFUSION 10 MG/ML OPTIME: INTRAVENOUS | Status: DC | PRN

## 2015-03-26 MED ORDER — FENTANYL CITRATE (PF) 100 MCG/2ML IJ SOLN
25.0000 ug | Freq: Once | INTRAMUSCULAR | Status: AC
Start: 2015-03-26 — End: 2015-03-26
  Administered 2015-03-26: 25 ug via INTRAVENOUS

## 2015-03-26 MED ORDER — MIDAZOLAM HCL 5 MG/5ML IJ SOLN
INTRAMUSCULAR | Status: DC | PRN
  Administered 2015-03-26: 2 mg via INTRAVENOUS

## 2015-03-26 MED ORDER — HEPARIN SODIUM (PORCINE) 1000 UNIT/ML IJ SOLN
INTRAMUSCULAR | Status: DC | PRN
  Administered 2015-03-26: 10000 [IU] via INTRAVENOUS

## 2015-03-26 MED ORDER — ACETAMINOPHEN 325 MG PO TABS
650.0000 mg | ORAL_TABLET | ORAL | Status: DC | PRN
  Administered 2015-03-26: 650 mg via ORAL
  Filled 2015-03-26: qty 2

## 2015-03-26 MED ORDER — FENTANYL CITRATE (PF) 100 MCG/2ML IJ SOLN
INTRAMUSCULAR | Status: AC
  Filled 2015-03-26: qty 2

## 2015-03-26 MED ORDER — PROPOFOL 10 MG/ML IV BOLUS
INTRAVENOUS | Status: DC | PRN
  Administered 2015-03-26 (×2): 20 mg via INTRAVENOUS
  Administered 2015-03-26: 50 mg via INTRAVENOUS
  Administered 2015-03-26: 200 mg via INTRAVENOUS

## 2015-03-26 MED ORDER — SODIUM CHLORIDE 0.9 % IJ SOLN
3.0000 mL | Freq: Two times a day (BID) | INTRAMUSCULAR | Status: DC
  Administered 2015-03-26 (×2): 3 mL via INTRAVENOUS

## 2015-03-26 MED ORDER — BUPIVACAINE HCL (PF) 0.25 % IJ SOLN
INTRAMUSCULAR | Status: DC | PRN
  Administered 2015-03-26: 20 mL

## 2015-03-26 MED ORDER — PROMETHAZINE HCL 25 MG/ML IJ SOLN
25.0000 mg | Freq: Once | INTRAMUSCULAR | Status: AC
  Administered 2015-03-26: 25 mg via INTRAVENOUS
  Filled 2015-03-26: qty 1

## 2015-03-26 MED ORDER — RIVAROXABAN 20 MG PO TABS
20.0000 mg | ORAL_TABLET | Freq: Every day | ORAL | Status: DC
  Administered 2015-03-26: 20 mg via ORAL
  Filled 2015-03-26 (×2): qty 1

## 2015-03-26 MED ORDER — LIDOCAINE HCL (CARDIAC) 20 MG/ML IV SOLN
INTRAVENOUS | Status: DC | PRN
  Administered 2015-03-26: 60 mg via INTRAVENOUS

## 2015-03-26 MED ORDER — SODIUM CHLORIDE 0.9 % IV SOLN: 250.0000 mL | INTRAVENOUS | Status: DC | PRN

## 2015-03-26 MED ORDER — ISOPROTERENOL HCL 0.2 MG/ML IJ SOLN
2.0000 ug/min | INTRAMUSCULAR | Status: AC
  Administered 2015-03-26: 20 ug/min via INTRAVENOUS
  Filled 2015-03-26: qty 2

## 2015-03-26 MED ORDER — HYDROCODONE-ACETAMINOPHEN 5-325 MG PO TABS
1.0000 | ORAL_TABLET | ORAL | Status: DC | PRN
  Administered 2015-03-26 – 2015-03-27 (×2): 1 via ORAL
  Filled 2015-03-26 (×2): qty 1

## 2015-03-26 SURGICAL SUPPLY — 21 items
BAG SNAP BAND KOVER 36X36 (MISCELLANEOUS) ×2 IMPLANT
BLANKET WARM UNDERBOD FULL ACC (MISCELLANEOUS) ×2 IMPLANT
CATH DIAG 6FR PIGTAIL (CATHETERS) ×2 IMPLANT
CATH NAVISTAR SMARTTOUCH DF (ABLATOR) ×2 IMPLANT
CATH SOUNDSTAR 3D IMAGING (CATHETERS) ×2 IMPLANT
CATH VARIABLE LASSO NAV 2515 (CATHETERS) ×2 IMPLANT
CATH WEBSTER BI DIR CS D-F CRV (CATHETERS) ×2 IMPLANT
COVER SWIFTLINK CONNECTOR (BAG) ×2 IMPLANT
NEEDLE TRANSEP BRK 71CM 407200 (NEEDLE) ×2 IMPLANT
PACK EP LATEX FREE (CUSTOM PROCEDURE TRAY) ×1
PACK EP LF (CUSTOM PROCEDURE TRAY) ×1 IMPLANT
PAD DEFIB LIFELINK (PAD) ×2 IMPLANT
PATCH CARTO3 (PAD) ×2 IMPLANT
SHEATH AVANTI 11F 11CM (SHEATH) ×2 IMPLANT
SHEATH PINNACLE 7F 10CM (SHEATH) ×4 IMPLANT
SHEATH PINNACLE 9F 10CM (SHEATH) ×2 IMPLANT
SHEATH SWARTZ TS SL2 63CM 8.5F (SHEATH) ×2 IMPLANT
SHIELD RADPAD SCOOP 12X17 (MISCELLANEOUS) ×2 IMPLANT
SYR MEDRAD MARK V 150ML (SYRINGE) ×2 IMPLANT
TUBING CONTRAST HIGH PRESS 48 (TUBING) ×2 IMPLANT
TUBING SMART ABLATE COOLFLOW (TUBING) ×2 IMPLANT

## 2015-03-26 NOTE — Anesthesia Procedure Notes (Addendum)
Procedure Name: MAC Date/Time: 03/26/2015 7:43 AM Performed by: Kyung Rudd Pre-anesthesia Checklist: Patient identified, Emergency Drugs available, Suction available and Patient being monitored Patient Re-evaluated:Patient Re-evaluated prior to inductionOxygen Delivery Method: Circle system utilized Preoxygenation: Pre-oxygenation with 100% oxygen Intubation Type: IV induction LMA: LMA inserted LMA Size: 4.0 Number of attempts: 1 Placement Confirmation: positive ETCO2,  CO2 detector and breath sounds checked- equal and bilateral Tube secured with: Tape Dental Injury: Teeth and Oropharynx as per pre-operative assessment  Comments: #4 LMA insterted per Lavona Mound, SRNA with Dr. Ola Spurr supervising.

## 2015-03-26 NOTE — Anesthesia Preprocedure Evaluation (Addendum)
Anesthesia Evaluation  Patient identified by MRN, date of birth, ID band Patient awake    Reviewed: Allergy & Precautions, NPO status , Patient's Chart, lab work & pertinent test results  Airway Mallampati: I  TM Distance: >3 FB Neck ROM: Full    Dental  (+) Teeth Intact   Pulmonary neg pulmonary ROS,  breath sounds clear to auscultation        Cardiovascular + dysrhythmias Atrial Fibrillation Rhythm:Regular Rate:Normal     Neuro/Psych negative neurological ROS     GI/Hepatic negative GI ROS, Neg liver ROS,   Endo/Other  negative endocrine ROSdiabetes  Renal/GU negative Renal ROS     Musculoskeletal   Abdominal   Peds  Hematology negative hematology ROS (+)   Anesthesia Other Findings   Reproductive/Obstetrics                           Lab Results  Component Value Date   WBC 6.5 03/15/2015   HGB 13.5 03/15/2015   HCT 40.7 03/15/2015   MCV 90.1 03/15/2015   PLT 224.0 03/15/2015   Lab Results  Component Value Date   CREATININE 0.74 03/15/2015   BUN 16 03/15/2015   NA 137 03/15/2015   K 4.0 03/15/2015   CL 103 03/15/2015   CO2 29 03/15/2015    Anesthesia Physical Anesthesia Plan  ASA: II  Anesthesia Plan: MAC   Post-op Pain Management:    Induction: Intravenous  Airway Management Planned: Natural Airway and Simple Face Mask  Additional Equipment:   Intra-op Plan:   Post-operative Plan:   Informed Consent: I have reviewed the patients History and Physical, chart, labs and discussed the procedure including the risks, benefits and alternatives for the proposed anesthesia with the patient or authorized representative who has indicated his/her understanding and acceptance.     Plan Discussed with: CRNA  Anesthesia Plan Comments:        Anesthesia Quick Evaluation

## 2015-03-26 NOTE — H&P (Signed)
PCP: Lovenia Kim, MD  The patient presents today for AF ablation. Since last being seen in our clinic, the patient reports doing very well. She has afib rarely and always at night, lasting up to 1-2 hours. She says that when working out at [employer], she has been told that her heart rates are >220 bpm frequently. She recently had an exercise treadmill test in our office during which she did not have any exercise induced arrhythmias. Today, she denies symptoms of chest pain, shortness of breath, orthopnea, PND, lower extremity edema, dizziness, presyncope, syncope, or neurologic sequela. The patient feels that she is tolerating medications without difficulties and is otherwise without complaint today.    Past Medical History  Diagnosis Date  . OCD (obsessive compulsive disorder)   . Insomnia   . Anxiety   . Depression   . Atrial fibrillation     adm 01/2014  . Atrial fibrillation with RVR   . SVT (supraventricular tachycardia)    Past Surgical History  Procedure Laterality Date  . Diagnostic laparoscopy    . Cesarean section  2007  . Appendectomy    . Laparoscopy  10/30/2011    Procedure: LAPAROSCOPY DIAGNOSTIC; Surgeon: Lovenia Kim, MD; Location: [facility]; Service: Gynecology; Laterality: N/A;    Current Outpatient Prescriptions  Medication Sig Dispense Refill  . BETAINE PO Take 1-2 tablets by mouth with each meal or shake    . buPROPion (WELLBUTRIN XL) 300 MG 24 hr tablet Take 300 mg by mouth daily.    Marland Kitchen CINNAMON PO Take 1/2 teaspoon by mouth daily    . Coenzyme Q10 (CO Q 10 PO) Co Q MAX Take 1 tablet by mouth every other day    . diltiazem (CARDIZEM) 30 MG tablet Take 30 mg by mouth daily as needed (AFIB).    Marland Kitchen EVENING PRIMROSE OIL PO Take 1,300 mg by mouth 2 (two) times daily.    . flecainide (TAMBOCOR) 100 MG tablet Take 1-3 tablets by mouth as needed at onset of fast heart  rate(afib) 12 tablet 0  . GLUCOPHAGE 500 MG tablet Take 500 mg by mouth 2 (two) times daily.  2  . ibuprofen (ADVIL,MOTRIN) 200 MG tablet Take 800 mg by mouth every 6 (six) hours as needed for headache.     . liothyronine (CYTOMEL) 5 MCG tablet Take 10 mcg by mouth 2 (two) times daily.    . Multiple Vitamin (MULITIVITAMIN WITH MINERALS) TABS Take 1 tablet by mouth at bedtime. Perfect Fit MultiVitamin    . NON FORMULARY RelaxMax Take 1 scoop by mouth at bedtime and daily as needed for relaxation    . NON FORMULARY SeroTrex L-Theanine Take 2 tablets by mouth daily as needed for supplement    . NON FORMULARY OptiMag Neuro Take 1/2 tsp powder scoop by mouth twice daily    . NON FORMULARY Phosphatidylcholine Take 3 tablets by mouth daily    . NON FORMULARY Phosphotidyl serine Take 2 tablets by mouth daily    . NON FORMULARY Omega Marine Oil Liquid Take 1 tsp by mouth daily    . NON FORMULARY Chromate GTF chromium polynicotinate 600 mcg Take 1 tablet by mouth twice a day    . NON FORMULARY Celtic Sea Salt Take 1/8 tsp by mouth daily    . NON FORMULARY Unique E 400 IU Take 1 tablet by mouth daily with food in AM    . NON FORMULARY There Biotic Detoxification Support, 50 bIL - Tale one tablet by mouth  daily    . NON FORMULARY Sacro B Take 1 tablet by mouth twice daily    . Nutritional Supplements (DHEA PO) Take 2 mg by mouth daily.    . Progesterone Micronized (PROGESTERONE PO) Take 100 mg by mouth cycle days 15-28    . QUEtiapine (SEROQUEL) 25 MG tablet Take 12.5 mg by mouth at bedtime as needed (sleep).     . rivaroxaban (XARELTO) 20 MG TABS tablet Take 1 tablet (20 mg total) by mouth daily with supper. 30 tablet 11  . vitamin A 25000 UNIT capsule Take 25,000 Units by mouth daily.    . Vitamin D, Ergocalciferol, (DRISDOL) 50000 UNITS CAPS capsule Take 50,000 Units by mouth as directed. Take every 5  days    . Vortioxetine HBr (BRINTELLIX) 10 MG TABS Take 1 tablet by mouth daily.    Marland Kitchen ZINC GLUCONATE PO Take 20 mg by mouth 2 (two) times daily.     No current facility-administered medications for this visit.    No Known Allergies  History   Social History  . Marital Status: Married    Spouse Name: N/A  . Number of Children: N/A  . Years of Education: N/A   Occupational History  . Not on file.   Social History Main Topics  . Smoking status: Never Smoker   . Smokeless tobacco: Not on file  . Alcohol Use: Yes     Comment: occasionally  . Drug Use: No  . Sexual Activity: Not on file   Other Topics Concern  . Not on file   Social History Narrative    Family History  Problem Relation Age of Onset  . COPD Mother   . Heart attack Father     ROS- All systems are reviewed and are negative except as outlined in the HPI above  Physical Exam: Filed Vitals:                  Filed Vitals:   03/26/15 0542  BP: 99/67  Pulse: 73  Temp: 97.9 F (36.6 C)  Resp: 18   GEN- The patient is well appearing, alert and oriented x 3 today.  Head- normocephalic, atraumatic Eyes- Sclera clear, conjunctiva pink Ears- hearing intact Oropharynx- clear Neck- supple, Lungs- Clear to ausculation bilaterally, normal work of breathing Heart- Regular rate and rhythm, no murmurs, rubs or gallops, PMI not laterally displaced GI- soft, NT, ND, + BS Extremities- no clubbing, cyanosis, or edema MS- no significant deformity or atrophy Skin- no rash or lesion Psych- euthymic mood, full affect Neuro- strength and sensation are intact    Assessment and Plan: 1. Paroxysmal atrial fibrillation She continues to have intermittent afib with RVR.  Therapeutic strategies for afib including medicine and ablation were discussed in detail with the patient and her spouse today today. Risk, benefits, and  alternatives to EP study and radiofrequency ablation for afib were also discussed in detail today. These risks include but are not limited to stroke, bleeding, vascular damage, tamponade, perforation, damage to the esophagus, lungs, and other structures, pulmonary vein stenosis, worsening renal function, and death. The patient understands these risk and wishes to proceed.  Her chads2vasc score is 1 (female)

## 2015-03-26 NOTE — Progress Notes (Signed)
Still complaining of migrain headache, ice pack in used. Continue to monitor.

## 2015-03-26 NOTE — Discharge Summary (Signed)
ELECTROPHYSIOLOGY PROCEDURE DISCHARGE SUMMARY    Patient ID: [name],  MRN: [id number], DOB/AGE: April 11, 1971 44 y.o.  Admit date: 03/26/2015 Discharge date: 03/27/2015  Primary Care Physician: Lovenia Kim, MD Electrophysiologist: [name], MD  Primary Discharge Diagnosis:  Paroxysmal atrial fibrillation s/p ablation this admission  Secondary Discharge Diagnosis:  1.  OCD 2.  Insomnia 3.  Anxiety 4.  Depression  Procedures This Admission:  1.  Electrophysiology study and radiofrequency catheter ablation on 03/26/15 by Dr [name].  This study demonstrated sinus rhythm upon presentation; rotational Angiography reveals a small (normal) sized left atrium with four separate pulmonary veins without evidence of pulmonary vein stenosis; successful electrical isolation and anatomical encircling of all four pulmonary veins with radiofrequency current; no inducible arrhythmias following ablation both on and off of Isuprel; no early apparent complications..    Brief HPI: [name] is a 44 y.o. female with a history of paroxysmal atrial fibrillation.  They have failed medical therapy with Flecainide. Risks, benefits, and alternatives to catheter ablation of atrial fibrillation were reviewed with the patient who wished to proceed.  The patient underwent TEE prior to the procedure which demonstrated normal LV function and no LAA thrombus.    Hospital Course:  The patient was admitted and underwent EPS/RFCA of atrial fibrillation with details as outlined above.  They were monitored on telemetry overnight which demonstrated sinus rhythm.  Groin was without complication on the day of discharge.  The patient was examined and considered to be stable for discharge.  Wound care and restrictions were reviewed with the patient.  The patient will be seen back by Dr Rayann Heman in 12 weeks for post ablation follow up.   This patients CHA2DS2-VASc Score and unadjusted Ischemic Stroke Rate (% per year)  is equal to 0.6 % stroke rate/year from a score of 1 Above score calculated as 1 point each if present [CHF, HTN, DM, Vascular=MI/PAD/Aortic Plaque, Age if 65-74, or Female] Above score calculated as 2 points each if present [Age > 75, or Stroke/TIA/TE]  She has a prior history of diabetes, but has not been on medications in several years and her sugars are well controlled, therefore no statin at this time. I did discuss this with her today.  She is hoping to avoid medicine but may be willing to reconsider in the future.     Physical Exam: Filed Vitals:   03/26/15 1800 03/26/15 1938 03/26/15 2325 03/27/15 0313  BP: 97/57 100/58 85/54 90/63   Pulse: 105 107 93 90  Temp:  98.5 F (36.9 C) 98.1 F (36.7 C) 97.9 F (36.6 C)  TempSrc:  Oral Oral Oral  Resp: 22 19 12 13   Height:      Weight:      SpO2: 96% 99% 99% 96%    GEN- The patient is well appearing, alert and oriented x 3 today.   HEENT: normocephalic, atraumatic; sclera clear, conjunctiva pink; hearing intact; oropharynx clear; neck supple, no JVP Lymph- no cervical lymphadenopathy Lungs- Clear to ausculation bilaterally, normal work of breathing.  No wheezes, rales, rhonchi Heart- Regular rate and rhythm, no murmurs, rubs or gallops, PMI not laterally displaced GI- soft, non-tender, non-distended, bowel sounds present, no hepatosplenomegaly Extremities- no clubbing, cyanosis, or edema; DP/PT/radial pulses 2+ bilaterally, groin without hematoma/bruit MS- no significant deformity or atrophy Skin- warm and dry, no rash or lesion Psych- euthymic mood, full affect Neuro- strength and sensation are intact   Labs:   Lab Results  Component Value Date  WBC 6.5 03/15/2015   HGB 13.5 03/15/2015   HCT 40.7 03/15/2015   MCV 90.1 03/15/2015   PLT 224.0 03/15/2015     Recent Labs Lab 03/27/15 0311  NA 139  K 4.0  CL 107  CO2 27  BUN 9  CREATININE 0.67  CALCIUM 8.5*  GLUCOSE 86     Discharge Medications:      Medication List    TAKE these medications        flecainide 100 MG tablet  Commonly known as:  TAMBOCOR  Take 1-3 tablets by mouth as needed at onset of fast heart rate(afib)     pantoprazole 40 MG tablet  Commonly known as:  PROTONIX  Take 1 tablet (40 mg total) by mouth daily.     rivaroxaban 20 MG Tabs tablet  Commonly known as:  XARELTO  Take 1 tablet (20 mg total) by mouth daily with supper.        Disposition:   Follow-up Information    Follow up with [facility] On 04/22/2015.   Why:  at 8:30AM   Contact information:   [place] [phone]      Follow up with [name], MD On 07/01/2015.   Specialty:  Cardiology   Why:  at 9:30AM   Contact information:   [place] [address] [phone]       Duration of Discharge Encounter: Greater than 30 minutes including physician time.  Army Fossa MD  03/27/2015 7:25 AM

## 2015-03-26 NOTE — Anesthesia Postprocedure Evaluation (Signed)
  Anesthesia Post-op Note  Patient: [name]  Procedure(s) Performed: Procedure(s): Atrial Fibrillation Ablation (N/A)  Patient Location: PACU  Anesthesia Type:General  Level of Consciousness: awake and alert   Airway and Oxygen Therapy: Patient Spontanous Breathing  Post-op Pain: none  Post-op Assessment: Post-op Vital signs reviewed              Post-op Vital Signs: Reviewed  Last Vitals:  Filed Vitals:   03/26/15 1430  BP: 107/67  Pulse: 102  Temp:   Resp: 16    Complications: No apparent anesthesia complications

## 2015-03-26 NOTE — Discharge Instructions (Signed)
No driving for 4 days. No lifting over 5 lbs for 1 week. No sexual activity for 1 week. You may return to work in 1 week. Keep procedure site clean & dry. If you notice increased pain, swelling, bleeding or pus, call/return!  You may shower, but no soaking baths/hot tubs/pools for 1 week.  ° ° °

## 2015-03-26 NOTE — Transfer of Care (Signed)
Immediate Anesthesia Transfer of Care Note  Patient: [name]  Procedure(s) Performed: Procedure(s): Atrial Fibrillation Ablation (N/A)  Patient Location: Cath Lab  Anesthesia Type:General  Level of Consciousness: awake, alert  and oriented  Airway & Oxygen Therapy: Patient Spontanous Breathing and Patient connected to nasal cannula oxygen  Post-op Assessment: Report given to RN, Post -op Vital signs reviewed and stable and Patient moving all extremities X 4  Post vital signs: Reviewed and stable  Last Vitals:  Filed Vitals:   03/26/15 0542  BP: 99/67  Pulse: 73  Temp: 36.6 C  Resp: 18    Complications: No apparent anesthesia complications

## 2015-03-26 NOTE — Progress Notes (Signed)
Site area: right groin a 7, 9, 11 french venous sheath was removed  Site Prior to Removal:  Level 0  Pressure Applied For 15 MINUTES    Minutes Beginning at 1120  Manual:   Yes.    Patient Status During Pull:  stable  Post Pull Groin Site:  Level 0  Post Pull Instructions Given:  Yes.    Post Pull Pulses Present:  Yes.    Dressing Applied:  Yes.    Comments:  VS remain stable during sheath pull.  Pt denies any discomfort at groin site at this time

## 2015-03-27 DIAGNOSIS — I480 Paroxysmal atrial fibrillation: Secondary | ICD-10-CM

## 2015-03-27 LAB — BASIC METABOLIC PANEL
Anion gap: 5 (ref 5–15)
BUN: 9 mg/dL (ref 6–20)
CALCIUM: 8.5 mg/dL — AB (ref 8.9–10.3)
CHLORIDE: 107 mmol/L (ref 101–111)
CO2: 27 mmol/L (ref 22–32)
Creatinine, Ser: 0.67 mg/dL (ref 0.44–1.00)
GFR calc Af Amer: >60 mL/min (ref >60)
GFR calc non Af Amer: >60 mL/min (ref >60)
GLUCOSE: 86 mg/dL (ref 65–99)
Potassium: 4 mmol/L (ref 3.5–5.1)
Sodium: 139 mmol/L (ref 135–145)

## 2015-03-27 MED ORDER — IBUPROFEN 200 MG PO TABS
200.0000 mg | ORAL_TABLET | Freq: Once | ORAL | Status: AC
  Administered 2015-03-27: 200 mg via ORAL
  Filled 2015-03-27: qty 1

## 2015-03-27 MED ORDER — PANTOPRAZOLE SODIUM 40 MG PO TBEC: 40.0000 mg | DELAYED_RELEASE_TABLET | Freq: Every day | ORAL | Status: DC

## 2015-04-09 ENCOUNTER — Telehealth (HOSPITAL_COMMUNITY): Payer: Self-pay | Admitting: *Deleted

## 2015-04-09 NOTE — Telephone Encounter (Signed)
Pt called in saying she was having strange pain under left rib area for last 4 days.  Comes and goes with movement mainly. She had taken OTC medication without much change. No chest pain or shortness of breath.  Patient reassured but would prefer to be seen and examined. Appointment made for 8/17 @ 9am

## 2015-04-10 ENCOUNTER — Encounter (HOSPITAL_COMMUNITY): Payer: Self-pay | Admitting: Nurse Practitioner

## 2015-04-10 ENCOUNTER — Ambulatory Visit (HOSPITAL_COMMUNITY)
Admission: RE | Admit: 2015-04-10 | Discharge: 2015-04-10 | Disposition: A | Discharge status: Home / Self Care | Payer: PRIVATE HEALTH INSURANCE | Source: Ambulatory Visit | Attending: Nurse Practitioner | Admitting: Nurse Practitioner

## 2015-04-10 VITALS — BP 100/80 | HR 94 | Ht 65.0 in | Wt 150.2 lb

## 2015-04-10 DIAGNOSIS — Z8249 Family history of ischemic heart disease and other diseases of the circulatory system: Secondary | ICD-10-CM | POA: Insufficient documentation

## 2015-04-10 DIAGNOSIS — E119 Type 2 diabetes mellitus without complications: Secondary | ICD-10-CM | POA: Insufficient documentation

## 2015-04-10 DIAGNOSIS — I480 Paroxysmal atrial fibrillation: Secondary | ICD-10-CM | POA: Diagnosis not present

## 2015-04-10 DIAGNOSIS — R1012 Left upper quadrant pain: Secondary | ICD-10-CM | POA: Insufficient documentation

## 2015-04-10 DIAGNOSIS — Z7902 Long term (current) use of antithrombotics/antiplatelets: Secondary | ICD-10-CM | POA: Diagnosis not present

## 2015-04-10 LAB — BASIC METABOLIC PANEL
Anion gap: 6 (ref 5–15)
BUN: 12 mg/dL (ref 6–20)
CO2: 30 mmol/L (ref 22–32)
Calcium: 9.5 mg/dL (ref 8.9–10.3)
Chloride: 104 mmol/L (ref 101–111)
Creatinine, Ser: 0.78 mg/dL (ref 0.44–1.00)
GFR calc non Af Amer: >60 mL/min (ref >60)
Glucose, Bld: 89 mg/dL (ref 65–99)
Potassium: 4.7 mmol/L (ref 3.5–5.1)
Sodium: 140 mmol/L (ref 135–145)

## 2015-04-10 LAB — CBC
HCT: 42.8 % (ref 36.0–46.0)
HEMOGLOBIN: 14.2 g/dL (ref 12.0–15.0)
MCH: 29.9 pg (ref 26.0–34.0)
MCHC: 33.2 g/dL (ref 30.0–36.0)
MCV: 90.1 fL (ref 78.0–100.0)
Platelets: 245 10*3/uL (ref 150–400)
RBC: 4.75 MIL/uL (ref 3.87–5.11)
RDW: 12.7 % (ref 11.5–15.5)
WBC: 6.4 10*3/uL (ref 4.0–10.5)

## 2015-04-10 NOTE — Progress Notes (Signed)
Patient ID: [name], female   DOB: 09-07-1970, 44 y.o.   MRN: [id number]     Primary Care Physician: Lovenia Kim, MD Referring Physician: Dr. Arturo Morton is a 44 y.o. female with a h/o afib ablation 03/26/15 that asked to be seen today with left upper quadrant pain. She reports that she has not had any swallowing issues, rt groin issues.  She did have a rash right after the procedure that did not itch and did resolve after a few days. She stopped her Protonix since it was a new drug, but a few days after the rash resolved, took it for a few days without issues. After that she was doing ok so stopped the protonix. Continues with the xarelto. No sustained afib since procedure.  She states 3-4 days ago she was aware of left upper quadrant discomfort that feels like a pressure or fullness. Sometimes will feel colicky. Can go about her business without having to stop for the pain but is aware that it is present.  Can eat as usual. Nothing that she can identify that will make pain worse or better. No diarrhea or constipation. No blood noted in the stool.   Today, she denies symptoms of palpitations, chest pain, shortness of breath, orthopnea, PND, lower extremity edema, dizziness, presyncope, syncope, or neurologic sequela. The patient is tolerating medications without difficulties and is otherwise without complaint today.   Past Medical History  Diagnosis Date  . OCD (obsessive compulsive disorder)   . Insomnia   . Anxiety   . Depression   . Atrial fibrillation     adm 01/2014  . Atrial fibrillation with RVR   . SVT (supraventricular tachycardia)   . Diabetes mellitus without complication     controlled with diet   Past Surgical History  Procedure Laterality Date  . Diagnostic laparoscopy    . Cesarean section  2007  . Appendectomy    . Laparoscopy  10/30/2011    Procedure: LAPAROSCOPY DIAGNOSTIC;  Surgeon: Lovenia Kim, MD;  Location: [facility];  Service: Gynecology;   Laterality: N/A;  . Tee without cardioversion N/A 03/25/2015    Procedure: TRANSESOPHAGEAL ECHOCARDIOGRAM (TEE);  Surgeon: Thayer Headings, MD;  Location: [facility];  Service: Cardiovascular;  Laterality: N/A;  . Electrophysiologic study N/A 03/26/2015    Procedure: Atrial Fibrillation Ablation;  Surgeon: [name], MD;  Location: [facility];  Service: Cardiovascular;  Laterality: N/A;    Current Outpatient Prescriptions  Medication Sig Dispense Refill  . flecainide (TAMBOCOR) 100 MG tablet Take 1-3 tablets by mouth as needed at onset of fast heart rate(afib) 12 tablet 0  . pantoprazole (PROTONIX) 40 MG tablet Take 1 tablet (40 mg total) by mouth daily. 45 tablet 0  . rivaroxaban (XARELTO) 20 MG TABS tablet Take 1 tablet (20 mg total) by mouth daily with supper. 30 tablet 11   No current facility-administered medications for this encounter.    No Known Allergies  Social History   Social History  . Marital Status: Married    Spouse Name: N/A  . Number of Children: N/A  . Years of Education: N/A   Occupational History  . Not on file.   Social History Main Topics  . Smoking status: Never Smoker   . Smokeless tobacco: Not on file  . Alcohol Use: Yes     Comment: occasionally  . Drug Use: No  . Sexual Activity: Not on file   Other Topics Concern  .  Not on file   Social History Narrative    Family History  Problem Relation Age of Onset  . COPD Mother   . Heart attack Father     ROS- All systems are reviewed and negative except as per the HPI above  Physical Exam: Filed Vitals:   04/10/15 0922 04/10/15 1332  BP: 88/78 100/80  Pulse: 94   Height: 5\' 5"  (1.651 m)   Weight: 150 lb 3.2 oz (68.13 kg)     GEN- The patient is well appearing, alert and oriented x 3 today.   Head- normocephalic, atraumatic Eyes-  Sclera clear, conjunctiva pink Ears- hearing intact Oropharynx- clear Neck- supple, no JVP Lymph- no cervical lymphadenopathy Lungs- Clear to  ausculation bilaterally, normal work of breathing Heart- Regular rate and rhythm, no murmurs, rubs or gallops, PMI not laterally displaced GI- soft, NT, ND, + BS Extremities- no clubbing, cyanosis, or edema MS- no significant deformity or atrophy Skin- no rash or lesion Psych- euthymic mood, full affect Neuro- strength and sensation are intact  EKG-SR at 94 bpm, Pr int 108 ms, QRs 74 ms, QTc 447 ms. Epic records reviewed.   Assessment and Plan: 1. PAF S/p ablation Continue xarelto  Flecainide as needed for breakthrough afib  2. Left upper quadrant  abdominal pain  Unsure as to etiology Do not think is a consequence of the procedure  Possibly stomach irritation from xarelto Go back on protonix  CBC, bmet today  If pain does not improve then may need to try changing blood thinner .  Pt will call on Friday to report current symptoms.  Discussed with Dr. Rayann Heman who also does not think current symptoms are related to the actual afib ablation  [place]. Lavonn Maxcy, [facility] [address] [place], [place] [address] [phone]

## 2015-04-10 NOTE — Patient Instructions (Signed)
The Provider has recommended to start these medications:  1) Protonix daily  2) call 04/12/2015 to report any symptoms.

## 2015-04-22 ENCOUNTER — Inpatient Hospital Stay (HOSPITAL_COMMUNITY)
Admission: RE | Admit: 2015-04-22 | Payer: PRIVATE HEALTH INSURANCE | Source: Ambulatory Visit | Admitting: Nurse Practitioner

## 2015-05-13 ENCOUNTER — Other Ambulatory Visit: Payer: Self-pay | Admitting: Internal Medicine

## 2015-07-01 ENCOUNTER — Encounter: Payer: Self-pay | Admitting: Internal Medicine

## 2015-07-01 ENCOUNTER — Ambulatory Visit (INDEPENDENT_AMBULATORY_CARE_PROVIDER_SITE_OTHER): Payer: PRIVATE HEALTH INSURANCE | Admitting: Internal Medicine

## 2015-07-01 VITALS — BP 100/62 | HR 106 | Ht 65.0 in | Wt 148.8 lb

## 2015-07-01 DIAGNOSIS — I480 Paroxysmal atrial fibrillation: Secondary | ICD-10-CM | POA: Diagnosis not present

## 2015-07-01 NOTE — Progress Notes (Signed)
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 female who presents today for routine electrophysiology followup.  Since her afib ablation, the patient reports doing very well.  Her AF is resolved.  She is pleased with the results of her procedure.  Today, she denies symptoms of palpitations, chest pain, shortness of breath,  lower extremity edema, dizziness, presyncope, or syncope.  The patient is otherwise without complaint today.   Past Medical History  Diagnosis Date  . OCD (obsessive compulsive disorder)   . Insomnia   . Anxiety   . Depression   . Atrial fibrillation ([facility])     adm 01/2014  . Atrial fibrillation with RVR ([facility])   . SVT (supraventricular tachycardia) ([facility])   . Diabetes mellitus without complication ([facility])     controlled with diet   Past Surgical History  Procedure Laterality Date  . Diagnostic laparoscopy    . Cesarean section  2007  . Appendectomy    . Laparoscopy  10/30/2011    Procedure: LAPAROSCOPY DIAGNOSTIC;  Surgeon: Lovenia Kim, MD;  Location: [facility];  Service: Gynecology;  Laterality: N/A;  . Tee without cardioversion N/A 03/25/2015    Procedure: TRANSESOPHAGEAL ECHOCARDIOGRAM (TEE);  Surgeon: Thayer Headings, MD;  Location: [facility];  Service: Cardiovascular;  Laterality: N/A;  . Electrophysiologic study N/A 03/26/2015    PVI by Dr Rayann Heman for afib    ROS- all systems are reviewed and negatives except as per HPI above  Current Outpatient Prescriptions  Medication Sig Dispense Refill  . flecainide (TAMBOCOR) 100 MG tablet Take 1-3 tablets by mouth as needed at onset of fast heart rate(afib) 12 tablet 0  . pantoprazole (PROTONIX) 40 MG tablet Take 40 mg by mouth daily.    . rivaroxaban (XARELTO) 20 MG TABS tablet Take 1 tablet (20 mg total) by mouth daily with supper. 30 tablet 11   No current facility-administered medications for this visit.    Physical Exam: Filed Vitals:   07/01/15 0957  BP: 100/62   Pulse: 106  Height: 5\' 5"  (1.651 m)  Weight: 148 lb 12.8 oz (67.495 kg)    GEN- The patient is well appearing, alert and oriented x 3 today.   Head- normocephalic, atraumatic Eyes-  Sclera clear, conjunctiva pink Ears- hearing intact Oropharynx- clear Lungs- Clear to ausculation bilaterally, normal work of breathing Heart- Regular rate and rhythm, no murmurs, rubs or gallops, PMI not laterally displaced GI- soft, NT, ND, + BS Extremities- no clubbing, cyanosis, or edema  ekg today reveals sinus tach, otherwise normal  Assessment and Plan:  1. Paroxysmal atrial fibrillation No episodes off of AAD therapy post ablation Stop xarelto today (chads2vasc score is 1-2)  2. Sinus tach Adequate hydration is encouraged  Return to see me in 3 months  [name] MD, [facility] 07/01/2015 10:18 AM

## 2015-07-01 NOTE — Patient Instructions (Signed)
Medication Instructions:   Your physician has recommended you make the following change in your medication:  1) Stop Protonix 2) Stop Xarelto   Labwork: None ordered   Testing/Procedures: None ordered   Follow-Up: Your physician recommends that you schedule a follow-up appointment in: 3 months with Dr Allred   Any Other Special Instructions Will Be Listed Below (If Applicable).     If you need a refill on your cardiac medications before your next appointment, please call your pharmacy.    

## 2015-10-03 ENCOUNTER — Ambulatory Visit: Payer: PRIVATE HEALTH INSURANCE | Admitting: Internal Medicine

## 2015-10-14 ENCOUNTER — Encounter: Payer: Self-pay | Admitting: Internal Medicine

## 2015-10-14 ENCOUNTER — Ambulatory Visit (INDEPENDENT_AMBULATORY_CARE_PROVIDER_SITE_OTHER): Payer: PRIVATE HEALTH INSURANCE | Admitting: Internal Medicine

## 2015-10-14 VITALS — BP 110/62 | HR 116 | Ht 65.0 in | Wt 148.6 lb

## 2015-10-14 DIAGNOSIS — I480 Paroxysmal atrial fibrillation: Secondary | ICD-10-CM

## 2015-10-14 NOTE — Progress Notes (Signed)
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   Her AF is resolved.  She has had 2 episodes of abrupt weakness (once in early December and one about 3 weeks ago).  She reports checking her pulse and feeling that it was "slow".  Denies presyncope or syncope. Today, she denies symptoms of palpitations, chest pain, shortness of breath,  lower extremity edema, or dizziness.  The patient is otherwise without complaint today.   Past Medical History  Diagnosis Date  . OCD (obsessive compulsive disorder)   . Insomnia   . Anxiety   . Depression   . Atrial fibrillation ([place])     adm 01/2014  . Atrial fibrillation with RVR ([place])   . SVT (supraventricular tachycardia) ([place])   . Diabetes mellitus without complication ([place])     controlled with diet   Past Surgical History  Procedure Laterality Date  . Diagnostic laparoscopy    . Cesarean section  2007  . Appendectomy    . Laparoscopy  10/30/2011    Procedure: LAPAROSCOPY DIAGNOSTIC;  Surgeon: Laura Kim, MD;  Location: [facility];  Service: Gynecology;  Laterality: N/A;  . Tee without cardioversion N/A 03/25/2015    Procedure: TRANSESOPHAGEAL ECHOCARDIOGRAM (TEE);  Surgeon: Thayer Headings, MD;  Location: [facility] ENDOSCOPY;  Service: Cardiovascular;  Laterality: N/A;  . Electrophysiologic study N/A 03/26/2015    PVI by Dr Rayann Heman for afib    ROS- all systems are reviewed and negatives except as per HPI above  Current Outpatient Prescriptions  Medication Sig Dispense Refill  . flecainide (TAMBOCOR) 100 MG tablet Take 1-3 tablets by mouth as needed at onset of fast heart rate(afib) 12 tablet 0  . FLUoxetine (PROZAC) 10 MG tablet Take 10 mg by mouth daily.     No current facility-administered medications for this visit.    Physical Exam: Filed Vitals:   10/14/15 1027  BP: 110/62  Pulse: 116  Height: 5\' 5"  (1.651 m)  Weight: 148 lb 9.6 oz  (67.405 kg)    GEN- The patient is well appearing, alert and oriented x 3 today.   Head- normocephalic, atraumatic Eyes-  Sclera clear, conjunctiva pink Ears- hearing intact Oropharynx- clear Lungs- Clear to ausculation bilaterally, normal work of breathing Heart- Regular rate and rhythm, no murmurs, rubs or gallops, PMI not laterally displaced GI- soft, NT, ND, + BS Extremities- no clubbing, cyanosis, or edema  ekg today reveals sinus tach, otherwise normal  Assessment and Plan:  1. Paroxysmal atrial fibrillation No episodes off of AAD therapy post ablation Stop xarelto today (chads2vasc score is 1-2)  2. Sinus tach Adequate hydration is encouraged She has AliveCor which she should use for any episodes of abrupt weakness to further characterize We also discussed the possibility of an implantable loop recorder. She would like to avoid this for now.  Return to see me in 3 months  [name] MD, [facility] 10/14/2015 4:07 PM

## 2015-10-14 NOTE — Patient Instructions (Signed)

## 2015-11-14 ENCOUNTER — Other Ambulatory Visit: Payer: Self-pay

## 2015-11-14 DIAGNOSIS — Z1231 Encounter for screening mammogram for malignant neoplasm of breast: Secondary | ICD-10-CM

## 2015-11-29 ENCOUNTER — Ambulatory Visit: Payer: PRIVATE HEALTH INSURANCE

## 2015-12-18 ENCOUNTER — Ambulatory Visit
Admission: RE | Admit: 2015-12-18 | Discharge: 2015-12-18 | Disposition: A | Discharge status: Home / Self Care | Payer: PRIVATE HEALTH INSURANCE | Source: Ambulatory Visit

## 2015-12-18 DIAGNOSIS — Z1231 Encounter for screening mammogram for malignant neoplasm of breast: Secondary | ICD-10-CM

## 2015-12-20 ENCOUNTER — Other Ambulatory Visit: Payer: Self-pay | Admitting: Obstetrics and Gynecology

## 2015-12-20 DIAGNOSIS — R928 Other abnormal and inconclusive findings on diagnostic imaging of breast: Secondary | ICD-10-CM

## 2015-12-24 ENCOUNTER — Ambulatory Visit
Admission: RE | Admit: 2015-12-24 | Discharge: 2015-12-24 | Disposition: A | Discharge status: Home / Self Care | Payer: PRIVATE HEALTH INSURANCE | Source: Ambulatory Visit | Attending: Obstetrics and Gynecology | Admitting: Obstetrics and Gynecology

## 2015-12-24 DIAGNOSIS — R928 Other abnormal and inconclusive findings on diagnostic imaging of breast: Secondary | ICD-10-CM

## 2016-01-15 ENCOUNTER — Ambulatory Visit (INDEPENDENT_AMBULATORY_CARE_PROVIDER_SITE_OTHER): Payer: PRIVATE HEALTH INSURANCE | Admitting: Internal Medicine

## 2016-01-15 ENCOUNTER — Encounter: Payer: Self-pay | Admitting: Internal Medicine

## 2016-01-15 VITALS — BP 100/70 | HR 99 | Ht 65.5 in | Wt 144.1 lb

## 2016-01-15 DIAGNOSIS — I480 Paroxysmal atrial fibrillation: Secondary | ICD-10-CM

## 2016-01-15 NOTE — Patient Instructions (Signed)

## 2016-01-15 NOTE — Progress Notes (Signed)
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   Her AF is resolved.  Doing well.  Denies presyncope or syncope. Today, she denies symptoms of palpitations, chest pain, shortness of breath,  lower extremity edema, or dizziness.  The patient is otherwise without complaint today.   Past Medical History  Diagnosis Date  . OCD (obsessive compulsive disorder)   . Insomnia   . Anxiety   . Depression   . Atrial fibrillation ([place])     adm 01/2014  . Atrial fibrillation with RVR ([place])   . SVT (supraventricular tachycardia) ([place])   . Diabetes mellitus without complication ([place])     controlled with diet   Past Surgical History  Procedure Laterality Date  . Diagnostic laparoscopy    . Cesarean section  2007  . Appendectomy    . Laparoscopy  10/30/2011    Procedure: LAPAROSCOPY DIAGNOSTIC;  Surgeon: Lovenia Kim, MD;  Location: [facility];  Service: Gynecology;  Laterality: N/A;  . Tee without cardioversion N/A 03/25/2015    Procedure: TRANSESOPHAGEAL ECHOCARDIOGRAM (TEE);  Surgeon: Thayer Headings, MD;  Location: [facility] ENDOSCOPY;  Service: Cardiovascular;  Laterality: N/A;  . Electrophysiologic study N/A 03/26/2015    PVI by Dr Rayann Heman for afib    ROS- all systems are reviewed and negatives except as per HPI above  Current Outpatient Prescriptions  Medication Sig Dispense Refill  . flecainide (TAMBOCOR) 100 MG tablet Take 1-3 tablets by mouth as needed at onset of fast heart rate(afib) 12 tablet 0  . FLUoxetine (PROZAC) 10 MG tablet Take 10 mg by mouth daily.     No current facility-administered medications for this visit.    Physical Exam: Filed Vitals:   01/15/16 1042  BP: 100/70  Pulse: 99  Height: 5' 5.5" (1.664 m)  Weight: 144 lb 1.9 oz (65.372 kg)    GEN- The patient is well appearing, alert and oriented x 3 today.   Head- normocephalic, atraumatic Eyes-  Sclera clear,  conjunctiva pink Ears- hearing intact Oropharynx- clear Lungs- Clear to ausculation bilaterally, normal work of breathing Heart- Regular rate and rhythm, no murmurs, rubs or gallops, PMI not laterally displaced GI- soft, NT, ND, + BS Extremities- no clubbing, cyanosis, or edema  ekg today reveals sinus rhythm 99 bpm, otherwise normal  Assessment and Plan:  1. Paroxysmal atrial fibrillation No episodes off of AAD therapy post ablation  2. Sinus tach Adequate hydration is encouraged  Return to see me in 6 months  [name] MD, [facility] 01/15/2016 11:00 AM

## 2017-05-13 ENCOUNTER — Emergency Department (HOSPITAL_COMMUNITY): Payer: PRIVATE HEALTH INSURANCE

## 2017-05-13 ENCOUNTER — Emergency Department (HOSPITAL_COMMUNITY)
Admission: EM | Admit: 2017-05-13 | Discharge: 2017-05-13 | Disposition: A | Discharge status: Home / Self Care | Payer: PRIVATE HEALTH INSURANCE | Attending: Emergency Medicine | Admitting: Emergency Medicine

## 2017-05-13 ENCOUNTER — Encounter (HOSPITAL_COMMUNITY): Payer: Self-pay

## 2017-05-13 DIAGNOSIS — I4891 Unspecified atrial fibrillation: Secondary | ICD-10-CM | POA: Diagnosis not present

## 2017-05-13 DIAGNOSIS — G43809 Other migraine, not intractable, without status migrainosus: Secondary | ICD-10-CM | POA: Diagnosis not present

## 2017-05-13 DIAGNOSIS — Z79899 Other long term (current) drug therapy: Secondary | ICD-10-CM | POA: Diagnosis not present

## 2017-05-13 DIAGNOSIS — E119 Type 2 diabetes mellitus without complications: Secondary | ICD-10-CM | POA: Diagnosis not present

## 2017-05-13 DIAGNOSIS — R51 Headache: Secondary | ICD-10-CM | POA: Diagnosis present

## 2017-05-13 LAB — I-STAT CHEM 8, ED
BUN: 14 mg/dL (ref 6–20)
CREATININE: 0.8 mg/dL (ref 0.44–1.00)
Calcium, Ion: 1.16 mmol/L (ref 1.15–1.40)
Chloride: 100 mmol/L — ABNORMAL LOW (ref 101–111)
Glucose, Bld: 83 mg/dL (ref 65–99)
HEMATOCRIT: 43 % (ref 36.0–46.0)
HEMOGLOBIN: 14.6 g/dL (ref 12.0–15.0)
Potassium: 3.8 mmol/L (ref 3.5–5.1)
Sodium: 139 mmol/L (ref 135–145)
TCO2: 27 mmol/L (ref 22–32)

## 2017-05-13 LAB — I-STAT BETA HCG BLOOD, ED (MC, WL, AP ONLY)

## 2017-05-13 MED ORDER — SODIUM CHLORIDE 0.9 % IV BOLUS (SEPSIS)
1000.0000 mL | Freq: Once | INTRAVENOUS | Status: AC
  Administered 2017-05-13: 1000 mL via INTRAVENOUS

## 2017-05-13 MED ORDER — ONDANSETRON 8 MG PO TBDP: 8.0000 mg | ORAL_TABLET | Freq: Three times a day (TID) | ORAL | 0 refills | Status: DC | PRN

## 2017-05-13 MED ORDER — KETOROLAC TROMETHAMINE 15 MG/ML IJ SOLN
15.0000 mg | Freq: Once | INTRAMUSCULAR | Status: AC
  Administered 2017-05-13: 15 mg via INTRAVENOUS
  Filled 2017-05-13: qty 1

## 2017-05-13 MED ORDER — ONDANSETRON HCL 4 MG/2ML IJ SOLN
4.0000 mg | Freq: Once | INTRAMUSCULAR | Status: AC
  Administered 2017-05-13: 4 mg via INTRAVENOUS
  Filled 2017-05-13: qty 2

## 2017-05-13 MED ORDER — PROCHLORPERAZINE EDISYLATE 5 MG/ML IJ SOLN
10.0000 mg | Freq: Once | INTRAMUSCULAR | Status: AC
  Administered 2017-05-13: 10 mg via INTRAVENOUS
  Filled 2017-05-13: qty 2

## 2017-05-13 MED ORDER — PREDNISONE 20 MG PO TABS: 40.0000 mg | ORAL_TABLET | Freq: Every day | ORAL | 0 refills | Status: AC

## 2017-05-13 NOTE — ED Notes (Signed)
Patient returned from MRI. MRI called stated patient could not complete MR Angiogram Doctor notified. Patient continues to be pain free.

## 2017-05-13 NOTE — ED Provider Notes (Signed)
[facility] DEPT Provider Note   CSN: [id number] Arrival date & time: 05/13/17  8841     History   Chief Complaint Chief Complaint  Patient presents with  . Migraine    HPI [name] is a 46 y.o. female.  HPI Patient is a 46 year old female with a history of migraine headaches who has not had any significant migraines for many years.  Over the past several months she has had some headaches mainly associated with the oncoming of her menstrual cycle.  Over the past 48 hours she's had increasing generalized headache with point discomfort in her right temporal region.  She states intermittent blurred vision in the periphery of her right eye.  She recently saw her optometrist and her eyes were checked and she was given a change in her contact lenses but no significant change the prescription was made.  Blood pressure is noted to be mildly elevated at the optometry office.  She denies weakness of her arms or legs.  No difficulty with her speech.  She did report some confusion today and it is not putting trash in the sink which is abnormal for.  She had difficulty remembering her street address and her doctor.  She did have an MRI for 15 years ago for history of headaches  Past Medical History:  Diagnosis Date  . Anxiety   . Atrial fibrillation ([place])    adm 01/2014  . Atrial fibrillation with RVR ([place])   . Depression   . Diabetes mellitus without complication ([place])    controlled with diet  . Insomnia   . OCD (obsessive compulsive disorder)   . SVT (supraventricular tachycardia) [facility])     Patient Active Problem List   Diagnosis Date Noted  . A-fib ([place]) 03/26/2015  . Palpitations 06/02/2014  . Snoring 02/21/2014  . OCD (obsessive compulsive disorder) 02/04/2014  . Anxiety 02/04/2014  . Depression 02/04/2014  . Paroxysmal A-fib ([place]) 02/03/2014    Past Surgical History:  Procedure Laterality Date  . APPENDECTOMY    . CESAREAN SECTION  2007  . DIAGNOSTIC LAPAROSCOPY    .  ELECTROPHYSIOLOGIC STUDY N/A 03/26/2015   PVI by Dr Rayann Heman for afib  . LAPAROSCOPY  10/30/2011   Procedure: LAPAROSCOPY DIAGNOSTIC;  Surgeon: Lovenia Kim, MD;  Location: [facility];  Service: Gynecology;  Laterality: N/A;  . TEE WITHOUT CARDIOVERSION N/A 03/25/2015   Procedure: TRANSESOPHAGEAL ECHOCARDIOGRAM (TEE);  Surgeon: Thayer Headings, MD;  Location: [facility];  Service: Cardiovascular;  Laterality: N/A;    OB History    No data available       Home Medications    Prior to Admission medications   Medication Sig Start Date End Date Taking? Authorizing Provider  flecainide (TAMBOCOR) 100 MG tablet Take 1-3 tablets by mouth as needed at onset of fast heart rate(afib) 02/11/15   Allred, Jeneen Rinks, MD  FLUoxetine (PROZAC) 10 MG tablet Take 10 mg by mouth daily.    [provider]    Family History Family History  Problem Relation Age of Onset  . COPD Mother   . Heart attack Father     Social History Social History  Substance Use Topics  . Smoking status: Never Smoker  . Smokeless tobacco: Not on file  . Alcohol use Yes     Comment: occasionally     Allergies   Patient has no known allergies.   Review of Systems Review of Systems  All other systems reviewed and are negative.    Physical  Exam Updated Vital Signs BP 112/78   Pulse 98   Resp 18   SpO2 100%   Physical Exam  Constitutional: She is oriented to person, place, and time. She appears well-developed and well-nourished. No distress.  HENT:  Head: Normocephalic and atraumatic.  Eyes: Pupils are equal, round, and reactive to light. EOM are normal.  Neck: Normal range of motion.  Cardiovascular: Normal rate and regular rhythm.   Pulmonary/Chest: Effort normal and breath sounds normal.  Abdominal: Soft. There is no tenderness.  Musculoskeletal: Normal range of motion.  Neurological: She is alert and oriented to person, place, and time.  5/5 strength in major muscle groups of  bilateral upper and  lower extremities. Speech normal. No facial asymetry.  Normal finger to nose bilaterally  Skin: Skin is warm and dry.  Psychiatric: She has a normal mood and affect. Judgment normal.  Nursing note and vitals reviewed.    ED Treatments / Results  Labs (all labs ordered are listed, but only abnormal results are displayed) Labs Reviewed  I-STAT CHEM 8, ED - Abnormal; Notable for the following:       Result Value   Chloride 100 (*)    All other components within normal limits  I-STAT BETA HCG BLOOD, ED ([facility], [facility], [facility] ONLY)    EKG  EKG Interpretation None       Radiology Mr Angiogram Head Wo Contrast  Result Date: 05/13/2017 CLINICAL DATA:  Headache, worst of life.  Memory loss. EXAM: MRI HEAD WITHOUT CONTRAST MRA HEAD WITHOUT CONTRAST TECHNIQUE: Multiplanar, multiecho pulse sequences of the brain and surrounding structures were obtained without intravenous contrast. Angiographic images of the head were obtained using MRA technique without contrast. COMPARISON:  01/30/2008 FINDINGS: MRI HEAD FINDINGS A full noncontrast brain MRI was planned, but the patient could not tolerate the confined space. Coronal and axial diffusion was obtained and is negative for infarct. There is no hydrocephalus, masslike finding, or shift. No evidence of debris or susceptibility for hemorrhage. MRA HEAD FINDINGS Symmetric carotid arteries. Slight left vertebral artery dominance. The circle-of-Willis is incomplete. There is no branch occlusion, stenosis, beading, or aneurysm. IMPRESSION: 1. Negative diffusion only brain MRI. 2. Normal intracranial MRA. Electronically Signed   By: Monte Fantasia M.D.   On: 05/13/2017 13:40   Mr Brain Wo Contrast  Result Date: 05/13/2017 CLINICAL DATA:  Headache, worst of life.  Memory loss. EXAM: MRI HEAD WITHOUT CONTRAST MRA HEAD WITHOUT CONTRAST TECHNIQUE: Multiplanar, multiecho pulse sequences of the brain and surrounding structures were obtained without intravenous contrast.  Angiographic images of the head were obtained using MRA technique without contrast. COMPARISON:  01/30/2008 FINDINGS: MRI HEAD FINDINGS A full noncontrast brain MRI was planned, but the patient could not tolerate the confined space. Coronal and axial diffusion was obtained and is negative for infarct. There is no hydrocephalus, masslike finding, or shift. No evidence of debris or susceptibility for hemorrhage. MRA HEAD FINDINGS Symmetric carotid arteries. Slight left vertebral artery dominance. The circle-of-Willis is incomplete. There is no branch occlusion, stenosis, beading, or aneurysm. IMPRESSION: 1. Negative diffusion only brain MRI. 2. Normal intracranial MRA. Electronically Signed   By: Monte Fantasia M.D.   On: 05/13/2017 13:40    Procedures Procedures (including critical care time)  Medications Ordered in ED Medications  prochlorperazine (COMPAZINE) injection 10 mg (10 mg Intravenous Given 05/13/17 1107)  ketorolac (TORADOL) 15 MG/ML injection 15 mg (15 mg Intravenous Given 05/13/17 1107)  sodium chloride 0.9 % bolus 1,000 mL (0 mLs Intravenous  Stopped 05/13/17 1202)  ondansetron (ZOFRAN) injection 4 mg (4 mg Intravenous Given 05/13/17 1103)     Initial Impression / Assessment and Plan / ED Course  I have reviewed the triage vital signs and the nursing notes.  Pertinent labs & imaging results that were available during my care of the patient were reviewed by me and considered in my medical decision making (see chart for details).     Symptomatically improved in the emergency department.  Suspect migraine headache.  Outpatient referral to neurology.  No evidence of aneurysm.  Diffusion weighted MRI without abnormality.  Unfortunately the patient could not complete a full MRI secondary to intolerance of the MRI scanner.  His symptoms persist may benefit from formal outpatient MRI.  Patient was discharged home with a prescription for prednisone if her headache were to return later tonight.   Otherwise she will try over-the-counter anti-inflammatories.  Home with Zofran.  Patient understands to return to the ER for new or worsening symptoms.  Doubt meningitis.  No weakness of her arms or legs.  Low suspicion for idiopathic intracranial hypertension  Final Clinical Impressions(s) / ED Diagnoses   Final diagnoses:  None    New Prescriptions New Prescriptions   No medications on file     Jola Schmidt, MD 05/13/17 1730

## 2017-05-13 NOTE — ED Notes (Signed)
Doctor at bedside.

## 2017-06-30 ENCOUNTER — Encounter (INDEPENDENT_AMBULATORY_CARE_PROVIDER_SITE_OTHER): Payer: Self-pay

## 2017-06-30 ENCOUNTER — Telehealth (HOSPITAL_COMMUNITY): Payer: Self-pay | Admitting: *Deleted

## 2017-06-30 NOTE — Telephone Encounter (Signed)
Pt called stating she has started having palpitations increasing in frequency over the last 2 weeks. Pt states she took flecainide PRN and this helped but once she ran out of it she started with Diltiazem 180mg  and this also helped smooth out the palpitations. Wondering what she should do -- offered appt today but pt would prefer to come when she is actually in the rhythm and plan to see Dr. Rayann Heman on Monday to further discuss. Pt instructed to call afib clinic if rhythm happens during business hours and we can bring in for quick EKG to catch rhythm. Scheduler has been notified to add on to Allred's schedule on Monday.

## 2017-07-05 ENCOUNTER — Ambulatory Visit: Payer: PRIVATE HEALTH INSURANCE | Admitting: Internal Medicine

## 2017-07-05 ENCOUNTER — Encounter (HOSPITAL_COMMUNITY)
Admission: RE | Disposition: A | Discharge status: Home / Self Care | Payer: Self-pay | Source: Ambulatory Visit | Attending: Internal Medicine

## 2017-07-05 ENCOUNTER — Encounter: Payer: Self-pay | Admitting: Internal Medicine

## 2017-07-05 ENCOUNTER — Ambulatory Visit (HOSPITAL_COMMUNITY)
Admission: RE | Admit: 2017-07-05 | Discharge: 2017-07-05 | Disposition: A | Discharge status: Home / Self Care | Payer: PRIVATE HEALTH INSURANCE | Source: Ambulatory Visit | Attending: Internal Medicine | Admitting: Internal Medicine

## 2017-07-05 VITALS — BP 98/70 | HR 110 | Ht 65.0 in | Wt 152.6 lb

## 2017-07-05 DIAGNOSIS — R002 Palpitations: Secondary | ICD-10-CM

## 2017-07-05 DIAGNOSIS — F329 Major depressive disorder, single episode, unspecified: Secondary | ICD-10-CM | POA: Diagnosis not present

## 2017-07-05 DIAGNOSIS — E119 Type 2 diabetes mellitus without complications: Secondary | ICD-10-CM | POA: Insufficient documentation

## 2017-07-05 DIAGNOSIS — F429 Obsessive-compulsive disorder, unspecified: Secondary | ICD-10-CM | POA: Insufficient documentation

## 2017-07-05 DIAGNOSIS — I471 Supraventricular tachycardia: Secondary | ICD-10-CM | POA: Diagnosis not present

## 2017-07-05 DIAGNOSIS — F419 Anxiety disorder, unspecified: Secondary | ICD-10-CM | POA: Diagnosis not present

## 2017-07-05 DIAGNOSIS — Z8673 Personal history of transient ischemic attack (TIA), and cerebral infarction without residual deficits: Secondary | ICD-10-CM | POA: Diagnosis not present

## 2017-07-05 DIAGNOSIS — I480 Paroxysmal atrial fibrillation: Secondary | ICD-10-CM | POA: Diagnosis not present

## 2017-07-05 DIAGNOSIS — G4700 Insomnia, unspecified: Secondary | ICD-10-CM | POA: Insufficient documentation

## 2017-07-05 HISTORY — PX: LOOP RECORDER INSERTION: EP1214

## 2017-07-05 SURGERY — LOOP RECORDER INSERTION

## 2017-07-05 MED ORDER — LIDOCAINE-EPINEPHRINE 1 %-1:100000 IJ SOLN
INTRAMUSCULAR | Status: DC | PRN
  Administered 2017-07-05: 30 mL

## 2017-07-05 MED ORDER — LIDOCAINE-EPINEPHRINE 1 %-1:100000 IJ SOLN
INTRAMUSCULAR | Status: AC
  Filled 2017-07-05: qty 1

## 2017-07-05 MED ORDER — FLECAINIDE ACETATE 100 MG PO TABS: ORAL_TABLET | ORAL | 3 refills | Status: DC

## 2017-07-05 SURGICAL SUPPLY — 2 items
LOOP REVEAL LINQSYS (Prosthesis & Implant Heart) ×2 IMPLANT
PACK LOOP INSERTION (CUSTOM PROCEDURE TRAY) ×2 IMPLANT

## 2017-07-05 NOTE — Interval H&P Note (Signed)
History and Physical Interval Note:  07/05/2017 4:10 PM  [name]  has presented today for surgery, with the diagnosis of stroke  The various methods of treatment have been discussed with the patient and family. After consideration of risks, benefits and other options for treatment, the patient has consented to  Procedure(s): LOOP RECORDER INSERTION (N/A) as a surgical intervention .  The patient's history has been reviewed, patient examined, no change in status, stable for surgery.  I have reviewed the patient's chart and labs.  Questions were answered to the patient's satisfaction.     [name]

## 2017-07-05 NOTE — Patient Instructions (Addendum)
Medication Instructions:  Your physician recommends that you continue on your current medications as directed. Please refer to the Current Medication list given to you today.  -- If you need a refill on your cardiac medications before your next appointment, please call your pharmacy. --  Labwork: TODAY:  TSH/BMET/MAGNESIUM   Testing/Procedures: LINQ Implant 11/12.  Please report to short stay  Follow-Up: Your physician wants you to follow-up in: 10-14 days in device clinic for wound check and 6 weeks with Dr. Rayann Heman.     Thank you for choosing [facility]!!   Frederik Schmidt, RN [phone]  Any Other Special Instructions Will Be Listed Below (If Applicable).

## 2017-07-05 NOTE — Progress Notes (Signed)
PCP: Laura Few, MD Primary Cardiologist: Dr Marlou Porch Primary EP: Dr Rayann Heman  [name] is a 46 y.o. female who presents today for routine electrophysiology followup.  Since last being seen in our clinic, the patient reports doing very well.  She has noticed over the past Petty weeks that she has occasional palpitations.  These are short lived (mostly seconds) and typically terminate before she can get to her AliveCor.  She has occasional dizziness but is also very concerned that this could be afib. Today, she denies symptoms of chest pain, shortness of breath,  lower extremity edema, dizziness, presyncope, or syncope.  The patient is otherwise without complaint today.   Past Medical History:  Diagnosis Date  . Anxiety   . Atrial fibrillation ([place])    adm 01/2014  . Atrial fibrillation with RVR ([place])   . Depression   . Diabetes mellitus without complication ([place])    controlled with diet  . Insomnia   . OCD (obsessive compulsive disorder)   . SVT (supraventricular tachycardia) ([facility])    Past Surgical History:  Procedure Laterality Date  . APPENDECTOMY    . CESAREAN SECTION  2007  . DIAGNOSTIC LAPAROSCOPY      ROS- all systems are reviewed and negatives except as per HPI above  Current Outpatient Medications  Medication Sig Dispense Refill  . flecainide (TAMBOCOR) 100 MG tablet Take 1-3 tablets by mouth as needed at onset of fast heart rate(afib) 12 tablet 0  . ibuprofen (ADVIL,MOTRIN) 200 MG tablet Take 800 mg by mouth every 8 (eight) hours as needed for headache.    . ondansetron (ZOFRAN ODT) 8 MG disintegrating tablet Take 1 tablet (8 mg total) by mouth every 8 (eight) hours as needed for nausea or vomiting. 10 tablet 0  . venlafaxine XR (EFFEXOR-XR) 75 MG 24 hr capsule Take 75 mg by mouth 2 (two) times daily.  0  . busPIRone (BUSPAR) 15 MG tablet Take 15 mg daily as needed by mouth. As directed  2  . QUEtiapine (SEROQUEL) 50 MG tablet Take 50 mg at bedtime as needed by mouth.  sleep     No current facility-administered medications for this visit.     Physical Exam: Vitals:   07/05/17 1411  BP: 98/70  Pulse: (!) 110  SpO2: 96%  Weight: 152 lb 9.6 oz (69.2 kg)  Height: 5\' 5"  (1.651 m)    GEN- The patient is well appearing, alert and oriented x 3 today.   Head- normocephalic, atraumatic Eyes-  Sclera clear, conjunctiva pink Ears- hearing intact Oropharynx- clear Lungs- Clear to ausculation bilaterally, normal work of breathing Heart- Regular rate and rhythm, no murmurs, rubs or gallops, PMI not laterally displaced GI- soft, NT, ND, + BS Extremities- no clubbing, cyanosis, or edema  EKG tracing ordered today is personally reviewed and shows sinus tachycardia 110 bpm, otherwise normal ekg  Assessment and Plan:  1. Paroxysmal atrial fibrillation Doing well post ablation off AAD therapy chads2vasc score is 1.  I do not advise anticoagulation at this time. We discussed options of conservative management, 30 day monitor placement, continued monitoring with AliveCor, or implantable loop recorder placement.  Pros and cons of each options were discussed at length.  After careful consideration, she would like to proceed with implantable loop recorder placement at this time.  Risks of the procedure discussed with the patient who wishes to proceed. Bmet, mg, and tsh also ordered today  2. Sinus tachycardia Chronic Stable  Return to see me in  6 weeks  [name] MD, [facility] 07/05/2017 2:32 PM

## 2017-07-05 NOTE — H&P (View-Only) (Signed)
PCP: [name], MD Primary Cardiologist: Dr Marlou Porch Primary EP: Dr Rayann Heman  [name] is a 46 y.o. female who presents today for routine electrophysiology followup.  Since last being seen in our clinic, the patient reports doing very well.  She has noticed over the past few weeks that she has occasional palpitations.  These are short lived (mostly seconds) and typically terminate before she can get to her AliveCor.  She has occasional dizziness but is also very concerned that this could be afib. Today, she denies symptoms of chest pain, shortness of breath,  lower extremity edema, dizziness, presyncope, or syncope.  The patient is otherwise without complaint today.   Past Medical History:  Diagnosis Date  . Anxiety   . Atrial fibrillation ([place])    adm 01/2014  . Atrial fibrillation with RVR ([place])   . Depression   . Diabetes mellitus without complication ([place])    controlled with diet  . Insomnia   . OCD (obsessive compulsive disorder)   . SVT (supraventricular tachycardia) ([facility])    Past Surgical History:  Procedure Laterality Date  . APPENDECTOMY    . CESAREAN SECTION  2007  . DIAGNOSTIC LAPAROSCOPY      ROS- all systems are reviewed and negatives except as per HPI above  Current Outpatient Medications  Medication Sig Dispense Refill  . flecainide (TAMBOCOR) 100 MG tablet Take 1-3 tablets by mouth as needed at onset of fast heart rate(afib) 12 tablet 0  . ibuprofen (ADVIL,MOTRIN) 200 MG tablet Take 800 mg by mouth every 8 (eight) hours as needed for headache.    . ondansetron (ZOFRAN ODT) 8 MG disintegrating tablet Take 1 tablet (8 mg total) by mouth every 8 (eight) hours as needed for nausea or vomiting. 10 tablet 0  . venlafaxine XR (EFFEXOR-XR) 75 MG 24 hr capsule Take 75 mg by mouth 2 (two) times daily.  0  . busPIRone (BUSPAR) 15 MG tablet Take 15 mg daily as needed by mouth. As directed  2  . QUEtiapine (SEROQUEL) 50 MG tablet Take 50 mg at bedtime as needed by mouth.  sleep     No current facility-administered medications for this visit.     Physical Exam: Vitals:   07/05/17 1411  BP: 98/70  Pulse: (!) 110  SpO2: 96%  Weight: 152 lb 9.6 oz (69.2 kg)  Height: 5\' 5"  (1.651 m)    GEN- The patient is well appearing, alert and oriented x 3 today.   Head- normocephalic, atraumatic Eyes-  Sclera clear, conjunctiva pink Ears- hearing intact Oropharynx- clear Lungs- Clear to ausculation bilaterally, normal work of breathing Heart- Regular rate and rhythm, no murmurs, rubs or gallops, PMI not laterally displaced GI- soft, NT, ND, + BS Extremities- no clubbing, cyanosis, or edema  EKG tracing ordered today is personally reviewed and shows sinus tachycardia 110 bpm, otherwise normal ekg  Assessment and Plan:  1. Paroxysmal atrial fibrillation Doing well post ablation off AAD therapy chads2vasc score is 1.  I do not advise anticoagulation at this time. We discussed options of conservative management, 30 day monitor placement, continued monitoring with AliveCor, or implantable loop recorder placement.  Pros and cons of each options were discussed at length.  After careful consideration, she would like to proceed with implantable loop recorder placement at this time.  Risks of the procedure discussed with the patient who wishes to proceed. Bmet, mg, and tsh also ordered today  2. Sinus tachycardia Chronic Stable  Return to see me in  6 weeks  [name] MD, [facility] 07/05/2017 2:32 PM

## 2017-07-06 ENCOUNTER — Encounter (HOSPITAL_COMMUNITY): Payer: Self-pay | Admitting: Internal Medicine

## 2017-07-06 DIAGNOSIS — I480 Paroxysmal atrial fibrillation: Secondary | ICD-10-CM | POA: Diagnosis not present

## 2017-07-06 LAB — BASIC METABOLIC PANEL
BUN/Creatinine Ratio: 17 (ref 9–23)
BUN: 12 mg/dL (ref 6–24)
CHLORIDE: 100 mmol/L (ref 96–106)
CO2: 24 mmol/L (ref 20–29)
CREATININE: 0.72 mg/dL (ref 0.57–1.00)
Calcium: 9.4 mg/dL (ref 8.7–10.2)
GFR calc Af Amer: 117 mL/min/{1.73_m2} (ref >59)
GFR calc non Af Amer: 101 mL/min/{1.73_m2} (ref >59)
GLUCOSE: 84 mg/dL (ref 65–99)
Potassium: 4 mmol/L (ref 3.5–5.2)
SODIUM: 140 mmol/L (ref 134–144)

## 2017-07-06 LAB — THYROID PANEL WITH TSH
Free Thyroxine Index: 1.6 (ref 1.2–4.9)
T3 Uptake Ratio: 25 % (ref 24–39)
T4 TOTAL: 6.4 ug/dL (ref 4.5–12.0)
TSH: 2.73 u[IU]/mL (ref 0.450–4.500)

## 2017-07-06 LAB — MAGNESIUM: MAGNESIUM: 2.2 mg/dL (ref 1.6–2.3)

## 2017-07-12 ENCOUNTER — Ambulatory Visit (INDEPENDENT_AMBULATORY_CARE_PROVIDER_SITE_OTHER): Payer: Self-pay | Admitting: *Deleted

## 2017-07-12 DIAGNOSIS — R002 Palpitations: Secondary | ICD-10-CM

## 2017-07-12 DIAGNOSIS — I480 Paroxysmal atrial fibrillation: Secondary | ICD-10-CM

## 2017-07-12 LAB — CUP PACEART INCLINIC DEVICE CHECK
Implantable Pulse Generator Implant Date: 20181112
MDC IDC SESS DTM: 20181119162956

## 2017-07-12 NOTE — Progress Notes (Signed)
LINQ wound check. Steri-strips removed. Wound edges approximated without redness, swelling or drainage. Patient educated about wound care and Carelink monitoring. Battery: good, R waves 0.14mV. No episodes. ROV with JA 08/30/17.

## 2017-07-20 ENCOUNTER — Telehealth: Payer: Self-pay | Admitting: Cardiology

## 2017-07-20 NOTE — Telephone Encounter (Signed)
LMOM. Transmission received at 0005, no episodes from last night. 2 symptom episodes from 11/21 and 11/22, SR with PACs. [facility] number to return call if needed.

## 2017-07-20 NOTE — Telephone Encounter (Signed)
Patient called and stated that between 8:45 and 9:30 last night she felt an irregular heart beat, she felt dizzy and she could feel her heart beating in her chest. She stated that once the irregular heart beat went a way the dizziness went away. She did not use her symptom. Informed pt that a Device Tech RN will review and call back w/ the results.

## 2017-07-28 ENCOUNTER — Ambulatory Visit: Payer: PRIVATE HEALTH INSURANCE | Admitting: Internal Medicine

## 2017-08-04 ENCOUNTER — Ambulatory Visit (INDEPENDENT_AMBULATORY_CARE_PROVIDER_SITE_OTHER): Payer: PRIVATE HEALTH INSURANCE | Admitting: *Deleted

## 2017-08-04 DIAGNOSIS — R002 Palpitations: Secondary | ICD-10-CM | POA: Diagnosis not present

## 2017-08-05 NOTE — Progress Notes (Signed)
Carelink Summary Report / Loop Recorder 

## 2017-08-12 LAB — CUP PACEART REMOTE DEVICE CHECK
MDC IDC PG IMPLANT DT: 20181112
MDC IDC SESS DTM: 20181212213536

## 2017-08-13 ENCOUNTER — Telehealth: Payer: Self-pay | Admitting: *Deleted

## 2017-08-13 NOTE — Telephone Encounter (Signed)
LMOM (DPR) requesting manual Carelink transmission for review.  [facility] phone number for questions/concerns.

## 2017-08-13 NOTE — Telephone Encounter (Signed)
Manual transmission received.  Symptom episode ECG from 07/15/17 (noted on most recent summary report) appears SR w/ 4bts ?SVT.  ECG printed and placed in Dr. Otilio Connors folder for review.  Other episodes (from 11/12 and 11/21) appear SR/ST.

## 2017-08-23 ENCOUNTER — Encounter: Payer: PRIVATE HEALTH INSURANCE | Admitting: Internal Medicine

## 2017-08-25 ENCOUNTER — Other Ambulatory Visit: Payer: Self-pay | Admitting: Internal Medicine

## 2017-08-30 ENCOUNTER — Encounter: Payer: Self-pay | Admitting: Internal Medicine

## 2017-08-30 ENCOUNTER — Ambulatory Visit (INDEPENDENT_AMBULATORY_CARE_PROVIDER_SITE_OTHER): Payer: PRIVATE HEALTH INSURANCE | Admitting: Internal Medicine

## 2017-08-30 VITALS — BP 112/88 | HR 89 | Ht 66.0 in | Wt 155.8 lb

## 2017-08-30 DIAGNOSIS — R002 Palpitations: Secondary | ICD-10-CM

## 2017-08-30 DIAGNOSIS — I480 Paroxysmal atrial fibrillation: Secondary | ICD-10-CM

## 2017-08-30 LAB — CUP PACEART INCLINIC DEVICE CHECK
Date Time Interrogation Session: 20190107115151
MDC IDC PG IMPLANT DT: 20181112

## 2017-08-30 NOTE — Progress Notes (Signed)
   PCP: [name], MD Primary Cardiologist: Dr Marlou Porch Primary EP: Dr Rayann Heman  [name] is a 47 y.o. female who presents today for routine electrophysiology followup.  Since last being seen in our clinic, the patient reports doing very well.  + rare palpitations. Today, she denies symptoms of chest pain, shortness of breath,  lower extremity edema, dizziness, presyncope, or syncope.  The patient is otherwise without complaint today.   Past Medical History:  Diagnosis Date  . Anxiety   . Atrial fibrillation ([place])    adm 01/2014  . Atrial fibrillation with RVR ([place])   . Depression   . Diabetes mellitus without complication ([place])    controlled with diet  . Insomnia   . OCD (obsessive compulsive disorder)   . SVT (supraventricular tachycardia) (H[facility])    Past Surgical History:  Procedure Laterality Date  . APPENDECTOMY    . CESAREAN SECTION  2007  . DIAGNOSTIC LAPAROSCOPY    . ELECTROPHYSIOLOGIC STUDY N/A 03/26/2015   PVI by Dr Rayann Heman for afib  . LAPAROSCOPY  10/30/2011   Procedure: LAPAROSCOPY DIAGNOSTIC;  Surgeon: Lovenia Kim, MD;  Location: [facility];  Service: Gynecology;  Laterality: N/A;  . LOOP RECORDER INSERTION N/A 07/05/2017   Procedure: LOOP RECORDER INSERTION;  Surgeon: [name], MD;  Location: [facility];  Service: Cardiovascular;  Laterality: N/A;  . TEE WITHOUT CARDIOVERSION N/A 03/25/2015   Procedure: TRANSESOPHAGEAL ECHOCARDIOGRAM (TEE);  Surgeon: Thayer Headings, MD;  Location: [facility];  Service: Cardiovascular;  Laterality: N/A;    ROS- all systems are reviewed and negatives except as per HPI above  Current Outpatient Medications  Medication Sig Dispense Refill  . busPIRone (BUSPAR) 15 MG tablet Take 15 mg daily as needed by mouth. As directed  2  . flecainide (TAMBOCOR) 100 MG tablet Take 1-3 tablets by mouth as needed at onset of fast heart rate(afib) 12 tablet 3  . ibuprofen (ADVIL,MOTRIN) 200 MG tablet Take 800 mg by mouth every 8 (eight)  hours as needed for headache.    . ondansetron (ZOFRAN ODT) 8 MG disintegrating tablet Take 1 tablet (8 mg total) by mouth every 8 (eight) hours as needed for nausea or vomiting. 10 tablet 0  . QUEtiapine (SEROQUEL) 50 MG tablet Take 50 mg at bedtime as needed by mouth. sleep    . venlafaxine XR (EFFEXOR-XR) 75 MG 24 hr capsule Take 75 mg by mouth 2 (two) times daily.  0   No current facility-administered medications for this visit.     Physical Exam: Vitals:   08/30/17 0945  BP: 112/88  Pulse: 89  Weight: 155 lb 12.8 oz (70.7 kg)  Height: 5\' 6"  (1.676 m)    GEN- The patient is well appearing, alert and oriented x 3 today.   Head- normocephalic, atraumatic Eyes-  Sclera clear, conjunctiva pink Ears- hearing intact Oropharynx- clear Lungs- Clear to ausculation bilaterally, normal work of breathing Heart- Regular rate and rhythm, no murmurs, rubs or gallops, PMI not laterally displaced GI- soft, NT, ND, + BS Extremities- no clubbing, cyanosis, or edema  EKG tracing ordered today is personally reviewed and shows sinus rhythm 89 bpm  Assessment and Plan:  1. Paroxysmal atrial fibrillation ILR interrogation is personally reviewed today No afib episodes are found Symptomatic transmissions appear to be for PACs, nonsustained atach  Carelink Return to see me in 6 months  [name] MD, [facility] 08/30/2017 10:03 AM

## 2017-08-30 NOTE — Patient Instructions (Addendum)
Medication Instructions:  Your physician recommends that you continue on your current medications as directed. Please refer to the Current Medication list given to you today.  * If you need a refill on your cardiac medications before your next appointment, please call your pharmacy. *  Labwork: None ordered  Testing/Procedures: None ordered  Follow-Up: You will continue with monthly LINQ monitoring from home.  Your physician wants you to follow-up in: 6 months with Dr. Rayann Heman.  You will receive a reminder letter in the mail two months in advance. If you don't receive a letter, please call our office to schedule the follow-up appointment.  Thank you for choosing [facility]!!

## 2017-09-03 ENCOUNTER — Ambulatory Visit (INDEPENDENT_AMBULATORY_CARE_PROVIDER_SITE_OTHER): Payer: PRIVATE HEALTH INSURANCE | Admitting: *Deleted

## 2017-09-03 DIAGNOSIS — R002 Palpitations: Secondary | ICD-10-CM

## 2017-09-07 NOTE — Progress Notes (Signed)
Carelink Summary Report / Loop Recorder 

## 2017-09-14 LAB — CUP PACEART REMOTE DEVICE CHECK
Implantable Pulse Generator Implant Date: 20181112
MDC IDC SESS DTM: 20190111223658

## 2017-10-04 ENCOUNTER — Ambulatory Visit (INDEPENDENT_AMBULATORY_CARE_PROVIDER_SITE_OTHER): Payer: PRIVATE HEALTH INSURANCE | Admitting: *Deleted

## 2017-10-04 DIAGNOSIS — R002 Palpitations: Secondary | ICD-10-CM | POA: Diagnosis not present

## 2017-10-04 NOTE — Progress Notes (Signed)
Carelink Summary Report / Loop Recorder 

## 2017-10-27 LAB — CUP PACEART REMOTE DEVICE CHECK
Implantable Pulse Generator Implant Date: 20181112
MDC IDC SESS DTM: 20190210230636

## 2017-11-01 ENCOUNTER — Telehealth: Payer: Self-pay | Admitting: *Deleted

## 2017-11-01 NOTE — Telephone Encounter (Signed)
Transmission received. 1 symptom episode recorded- ST with PACs. She felt palpitations that "wouldn't let up" and it lasted about an hour. 2nd attempt to press symptom activator was not successful. Patient appreciative of call back. Direct # given to [facility].

## 2017-11-01 NOTE — Telephone Encounter (Signed)
Message received from [facility]- pt unable to call this office- on hold 30+ min.   [name] monitor last updated 0005 this morning- I advised her to send a manual transmission when she gets home- she will be home in 5 mins and is agreeable.

## 2017-11-05 ENCOUNTER — Ambulatory Visit (INDEPENDENT_AMBULATORY_CARE_PROVIDER_SITE_OTHER): Payer: PRIVATE HEALTH INSURANCE | Admitting: *Deleted

## 2017-11-05 DIAGNOSIS — R002 Palpitations: Secondary | ICD-10-CM | POA: Diagnosis not present

## 2017-11-08 NOTE — Progress Notes (Signed)
Carelink Summary Report / Loop Recorder 

## 2017-12-08 ENCOUNTER — Ambulatory Visit (INDEPENDENT_AMBULATORY_CARE_PROVIDER_SITE_OTHER): Payer: PRIVATE HEALTH INSURANCE | Admitting: *Deleted

## 2017-12-08 DIAGNOSIS — R002 Palpitations: Secondary | ICD-10-CM | POA: Diagnosis not present

## 2017-12-09 NOTE — Progress Notes (Signed)
Carelink Summary Report / Loop Recorder 

## 2017-12-11 LAB — CUP PACEART REMOTE DEVICE CHECK
MDC IDC PG IMPLANT DT: 20181112
MDC IDC SESS DTM: 20190316001111

## 2018-01-07 LAB — CUP PACEART REMOTE DEVICE CHECK
Implantable Pulse Generator Implant Date: 20181112
MDC IDC SESS DTM: 20190418003605

## 2018-01-10 ENCOUNTER — Ambulatory Visit (INDEPENDENT_AMBULATORY_CARE_PROVIDER_SITE_OTHER): Payer: PRIVATE HEALTH INSURANCE | Admitting: *Deleted

## 2018-01-10 DIAGNOSIS — R002 Palpitations: Secondary | ICD-10-CM | POA: Diagnosis not present

## 2018-01-11 NOTE — Progress Notes (Signed)
Carelink Summary Report / Loop Recorder 

## 2018-01-31 LAB — CUP PACEART REMOTE DEVICE CHECK
Implantable Pulse Generator Implant Date: 20181112
MDC IDC SESS DTM: 20190521013746

## 2018-02-14 ENCOUNTER — Ambulatory Visit (INDEPENDENT_AMBULATORY_CARE_PROVIDER_SITE_OTHER): Payer: PRIVATE HEALTH INSURANCE | Admitting: *Deleted

## 2018-02-14 DIAGNOSIS — R002 Palpitations: Secondary | ICD-10-CM | POA: Diagnosis not present

## 2018-02-14 NOTE — Progress Notes (Signed)
Carelink Summary Report / Loop Recorder 

## 2018-02-23 ENCOUNTER — Telehealth: Payer: Self-pay | Admitting: Cardiology

## 2018-02-23 NOTE — Telephone Encounter (Signed)
Spoke w/ pt and requested that she send a manual transmission b/c her home monitor has not updated in at least 14 days.   

## 2018-03-17 ENCOUNTER — Ambulatory Visit (INDEPENDENT_AMBULATORY_CARE_PROVIDER_SITE_OTHER): Payer: PRIVATE HEALTH INSURANCE | Admitting: *Deleted

## 2018-03-17 DIAGNOSIS — R002 Palpitations: Secondary | ICD-10-CM | POA: Diagnosis not present

## 2018-03-18 NOTE — Progress Notes (Signed)
Carelink Summary Report / Loop Recorder 

## 2018-03-29 LAB — CUP PACEART REMOTE DEVICE CHECK
Implantable Pulse Generator Implant Date: 20181112
MDC IDC SESS DTM: 20190623020525

## 2018-04-19 ENCOUNTER — Ambulatory Visit (INDEPENDENT_AMBULATORY_CARE_PROVIDER_SITE_OTHER): Payer: PRIVATE HEALTH INSURANCE | Admitting: *Deleted

## 2018-04-19 DIAGNOSIS — R002 Palpitations: Secondary | ICD-10-CM

## 2018-04-20 NOTE — Progress Notes (Signed)
Carelink Summary Report / Loop Recorder 

## 2018-05-02 LAB — CUP PACEART REMOTE DEVICE CHECK
Date Time Interrogation Session: 20190726024027
MDC IDC PG IMPLANT DT: 20181112

## 2018-05-17 LAB — CUP PACEART REMOTE DEVICE CHECK
Date Time Interrogation Session: 20190828023602
Implantable Pulse Generator Implant Date: 20181112

## 2018-05-23 ENCOUNTER — Ambulatory Visit (INDEPENDENT_AMBULATORY_CARE_PROVIDER_SITE_OTHER): Payer: PRIVATE HEALTH INSURANCE | Admitting: *Deleted

## 2018-05-23 DIAGNOSIS — R002 Palpitations: Secondary | ICD-10-CM

## 2018-05-23 NOTE — Progress Notes (Signed)
Carelink Summary Report / Loop Recorder 

## 2018-05-25 LAB — CUP PACEART REMOTE DEVICE CHECK
Date Time Interrogation Session: 20190930073944
Implantable Pulse Generator Implant Date: 20181112

## 2018-06-27 ENCOUNTER — Ambulatory Visit (INDEPENDENT_AMBULATORY_CARE_PROVIDER_SITE_OTHER): Payer: PRIVATE HEALTH INSURANCE | Admitting: *Deleted

## 2018-06-27 DIAGNOSIS — R002 Palpitations: Secondary | ICD-10-CM | POA: Diagnosis not present

## 2018-06-27 NOTE — Progress Notes (Signed)
Carelink Summary Report / Loop Recorder 

## 2018-07-28 ENCOUNTER — Ambulatory Visit (INDEPENDENT_AMBULATORY_CARE_PROVIDER_SITE_OTHER): Payer: PRIVATE HEALTH INSURANCE

## 2018-07-28 DIAGNOSIS — R002 Palpitations: Secondary | ICD-10-CM | POA: Diagnosis not present

## 2018-07-28 NOTE — Progress Notes (Signed)
Carelink Summary Report / Loop Recorder 

## 2018-08-20 LAB — CUP PACEART REMOTE DEVICE CHECK
Date Time Interrogation Session: 20191102073605
Implantable Pulse Generator Implant Date: 20181112

## 2018-08-30 ENCOUNTER — Ambulatory Visit (INDEPENDENT_AMBULATORY_CARE_PROVIDER_SITE_OTHER): Payer: Self-pay

## 2018-08-30 DIAGNOSIS — R002 Palpitations: Secondary | ICD-10-CM

## 2018-08-31 LAB — CUP PACEART REMOTE DEVICE CHECK
Date Time Interrogation Session: 20200107134100
MDC IDC PG IMPLANT DT: 20181112

## 2018-08-31 NOTE — Progress Notes (Signed)
Carelink Summary Report / Loop Recorder 

## 2018-09-01 ENCOUNTER — Other Ambulatory Visit: Payer: Self-pay | Admitting: Obstetrics and Gynecology

## 2018-09-01 DIAGNOSIS — Z1231 Encounter for screening mammogram for malignant neoplasm of breast: Secondary | ICD-10-CM

## 2018-09-11 LAB — CUP PACEART REMOTE DEVICE CHECK
Implantable Pulse Generator Implant Date: 20181112
MDC IDC SESS DTM: 20191205131059

## 2018-09-30 ENCOUNTER — Ambulatory Visit
Admission: RE | Admit: 2018-09-30 | Discharge: 2018-09-30 | Disposition: A | Discharge status: Home / Self Care | Payer: BLUE CROSS/BLUE SHIELD | Source: Ambulatory Visit | Attending: Obstetrics and Gynecology | Admitting: Obstetrics and Gynecology

## 2018-09-30 DIAGNOSIS — Z1231 Encounter for screening mammogram for malignant neoplasm of breast: Secondary | ICD-10-CM

## 2018-10-03 ENCOUNTER — Encounter: Payer: Self-pay | Admitting: Internal Medicine

## 2018-10-03 ENCOUNTER — Other Ambulatory Visit: Payer: Self-pay | Admitting: Obstetrics and Gynecology

## 2018-10-03 ENCOUNTER — Ambulatory Visit: Payer: BLUE CROSS/BLUE SHIELD | Admitting: Internal Medicine

## 2018-10-03 VITALS — BP 116/76 | HR 124 | Ht 66.0 in | Wt 139.0 lb

## 2018-10-03 DIAGNOSIS — R928 Other abnormal and inconclusive findings on diagnostic imaging of breast: Secondary | ICD-10-CM

## 2018-10-03 DIAGNOSIS — I480 Paroxysmal atrial fibrillation: Secondary | ICD-10-CM

## 2018-10-03 MED ORDER — NADOLOL 20 MG PO TABS: 10.0000 mg | ORAL_TABLET | Freq: Every day | ORAL | 3 refills | Status: DC

## 2018-10-03 NOTE — Progress Notes (Signed)
    PCP: [name], MD Primary Cardiologist: Dr Marlou Porch Primary EP:  Dr Rayann Heman  [name] is a 48 y.o. female who presents today for routine electrophysiology followup.  Since last being seen in our clinic, the patient reports doing well. She continues to have intermittent palpitations but has not had to use her flecainide. Her symptoms of palpitations and fatigue are most noticeable after exercise. ILR shows no afib. She is tachycardic today.   Today, she denies symptoms of chest pain, shortness of breath,  lower extremity edema, dizziness, presyncope, or syncope.  The patient is otherwise without complaint today.   Past Medical History:  Diagnosis Date  . Anxiety   . Atrial fibrillation ([place])    adm 01/2014  . Atrial fibrillation with RVR ([place])   . Depression   . Diabetes mellitus without complication ([place])    controlled with diet  . Insomnia   . OCD (obsessive compulsive disorder)   . SVT (supraventricular tachycardia) (H[facility])    Past Surgical History:  Procedure Laterality Date  . APPENDECTOMY    . CESAREAN SECTION  2007  . DIAGNOSTIC LAPAROSCOPY    . ELECTROPHYSIOLOGIC STUDY N/A 03/26/2015   PVI by Dr Rayann Heman for afib  . LAPAROSCOPY  10/30/2011   Procedure: LAPAROSCOPY DIAGNOSTIC;  Surgeon: Lovenia Kim, MD;  Location: [facility];  Service: Gynecology;  Laterality: N/A;  . LOOP RECORDER INSERTION N/A 07/05/2017   Procedure: LOOP RECORDER INSERTION;  Surgeon: [name], MD;  Location: [facility];  Service: Cardiovascular;  Laterality: N/A;  . TEE WITHOUT CARDIOVERSION N/A 03/25/2015   Procedure: TRANSESOPHAGEAL ECHOCARDIOGRAM (TEE);  Surgeon: Thayer Headings, MD;  Location: [facility];  Service: Cardiovascular;  Laterality: N/A;    ROS- all systems are reviewed and negative except as per HPI above  Current Outpatient Medications  Medication Sig Dispense Refill  . flecainide (TAMBOCOR) 100 MG tablet Take 1-3 tablets by mouth as needed at onset of fast heart  rate(afib) 12 tablet 3  . venlafaxine XR (EFFEXOR-XR) 75 MG 24 hr capsule Take 75 mg by mouth 2 (two) times daily.  0   No current facility-administered medications for this visit.     Physical Exam: Vitals:   10/03/18 1443  BP: 116/76  Pulse: (!) 124  SpO2: 97%  Weight: 139 lb (63 kg)  Height: 5\' 6"  (1.676 m)    GEN- The patient is well appearing, alert and oriented x 3 today.   Head- normocephalic, atraumatic Eyes-  Sclera clear, conjunctiva pink Ears- hearing intact Oropharynx- clear Lungs- Clear to ausculation bilaterally, normal work of breathing Chest- pacemaker pocket is well healed Heart- Regular rate and rhythm, tachycardic, no murmurs, rubs or gallops, PMI not laterally displaced GI- soft, NT, ND, + BS Extremities- no clubbing, cyanosis, or edema  ILR interrogation- reviewed in detail today,  See PACEART report  ekg tracing ordered today is personally reviewed and shows sinus tach HR 125, PR 112, QRS 76, QTc 476.  Assessment and Plan:  1. Paroxysmal atrial fibrillation IRL shows no afib. She continues to have palpitations which appear to be PACs and sinus tachycardia. PRN flecainide 100 mg daily. Will add nadolol 10mg  daily for sinus tachycardia  Carelink Follow up:  Return to see me in a year She will contact my office if symptoms worsen in the interim.  [name] MD, [facility] 10/03/2018 3:10 PM

## 2018-10-03 NOTE — Patient Instructions (Addendum)
Medication Instructions:  Your physician has recommended you make the following change in your medication:   1.  Start taking nadolol 20 mg--Take 1/2 tablet (10 mg) by mouth daily   Labwork: None ordered.  Testing/Procedures: None ordered.  Follow-Up: Your physician wants you to follow-up in: one year with Dr. Vallery Ridge will receive a reminder letter in the mail two months in advance. If you don't receive a letter, please call our office to schedule the follow-up appointment.  Remote monitoring monthly  Any Other Special Instructions Will Be Listed Below (If Applicable).  If you need a refill on your cardiac medications before your next appointment, please call your pharmacy.

## 2018-10-11 ENCOUNTER — Other Ambulatory Visit: Payer: Self-pay | Admitting: Obstetrics and Gynecology

## 2018-10-11 ENCOUNTER — Ambulatory Visit
Admission: RE | Admit: 2018-10-11 | Discharge: 2018-10-11 | Disposition: A | Discharge status: Home / Self Care | Payer: BLUE CROSS/BLUE SHIELD | Source: Ambulatory Visit | Attending: Obstetrics and Gynecology | Admitting: Obstetrics and Gynecology

## 2018-10-11 DIAGNOSIS — R928 Other abnormal and inconclusive findings on diagnostic imaging of breast: Secondary | ICD-10-CM

## 2018-10-11 DIAGNOSIS — N6001 Solitary cyst of right breast: Secondary | ICD-10-CM

## 2018-10-11 DIAGNOSIS — R922 Inconclusive mammogram: Secondary | ICD-10-CM | POA: Diagnosis not present

## 2018-10-11 DIAGNOSIS — N6312 Unspecified lump in the right breast, upper inner quadrant: Secondary | ICD-10-CM | POA: Diagnosis not present

## 2018-10-11 LAB — CUP PACEART INCLINIC DEVICE CHECK
Date Time Interrogation Session: 20200210195654
Implantable Pulse Generator Implant Date: 20181112

## 2018-10-20 DIAGNOSIS — F411 Generalized anxiety disorder: Secondary | ICD-10-CM | POA: Diagnosis not present

## 2018-10-20 DIAGNOSIS — F900 Attention-deficit hyperactivity disorder, predominantly inattentive type: Secondary | ICD-10-CM | POA: Diagnosis not present

## 2018-10-20 DIAGNOSIS — F429 Obsessive-compulsive disorder, unspecified: Secondary | ICD-10-CM | POA: Diagnosis not present

## 2018-11-03 DIAGNOSIS — Z85820 Personal history of malignant melanoma of skin: Secondary | ICD-10-CM | POA: Diagnosis not present

## 2018-11-03 DIAGNOSIS — D2272 Melanocytic nevi of left lower limb, including hip: Secondary | ICD-10-CM | POA: Diagnosis not present

## 2018-11-03 DIAGNOSIS — D225 Melanocytic nevi of trunk: Secondary | ICD-10-CM | POA: Diagnosis not present

## 2018-11-03 DIAGNOSIS — D2262 Melanocytic nevi of left upper limb, including shoulder: Secondary | ICD-10-CM | POA: Diagnosis not present

## 2018-11-03 DIAGNOSIS — D485 Neoplasm of uncertain behavior of skin: Secondary | ICD-10-CM | POA: Diagnosis not present

## 2018-12-07 ENCOUNTER — Ambulatory Visit (INDEPENDENT_AMBULATORY_CARE_PROVIDER_SITE_OTHER): Payer: BLUE CROSS/BLUE SHIELD | Admitting: *Deleted

## 2018-12-07 DIAGNOSIS — I480 Paroxysmal atrial fibrillation: Secondary | ICD-10-CM | POA: Diagnosis not present

## 2018-12-07 DIAGNOSIS — R002 Palpitations: Secondary | ICD-10-CM

## 2018-12-07 LAB — CUP PACEART REMOTE DEVICE CHECK
Date Time Interrogation Session: 20200415153713
Implantable Pulse Generator Implant Date: 20181112

## 2018-12-08 ENCOUNTER — Other Ambulatory Visit: Payer: Self-pay

## 2018-12-13 NOTE — Progress Notes (Signed)
Carelink Summary Report / Loop Recorder 

## 2018-12-20 DIAGNOSIS — F429 Obsessive-compulsive disorder, unspecified: Secondary | ICD-10-CM | POA: Diagnosis not present

## 2018-12-20 DIAGNOSIS — F411 Generalized anxiety disorder: Secondary | ICD-10-CM | POA: Diagnosis not present

## 2018-12-20 DIAGNOSIS — F900 Attention-deficit hyperactivity disorder, predominantly inattentive type: Secondary | ICD-10-CM | POA: Diagnosis not present

## 2019-01-09 ENCOUNTER — Other Ambulatory Visit: Payer: Self-pay

## 2019-01-09 ENCOUNTER — Ambulatory Visit (INDEPENDENT_AMBULATORY_CARE_PROVIDER_SITE_OTHER): Payer: BLUE CROSS/BLUE SHIELD | Admitting: *Deleted

## 2019-01-09 DIAGNOSIS — R002 Palpitations: Secondary | ICD-10-CM

## 2019-01-09 LAB — CUP PACEART REMOTE DEVICE CHECK
Date Time Interrogation Session: 20200518154114
Implantable Pulse Generator Implant Date: 20181112

## 2019-01-17 NOTE — Progress Notes (Signed)
Carelink Summary Report / Loop Recorder 

## 2019-02-13 ENCOUNTER — Ambulatory Visit (INDEPENDENT_AMBULATORY_CARE_PROVIDER_SITE_OTHER): Payer: BC Managed Care – PPO | Admitting: *Deleted

## 2019-02-13 DIAGNOSIS — I480 Paroxysmal atrial fibrillation: Secondary | ICD-10-CM

## 2019-02-13 DIAGNOSIS — R002 Palpitations: Secondary | ICD-10-CM | POA: Diagnosis not present

## 2019-02-13 LAB — CUP PACEART REMOTE DEVICE CHECK
Date Time Interrogation Session: 20200620160824
Implantable Pulse Generator Implant Date: 20181112

## 2019-02-21 DIAGNOSIS — F429 Obsessive-compulsive disorder, unspecified: Secondary | ICD-10-CM | POA: Diagnosis not present

## 2019-02-21 DIAGNOSIS — F900 Attention-deficit hyperactivity disorder, predominantly inattentive type: Secondary | ICD-10-CM | POA: Diagnosis not present

## 2019-02-21 DIAGNOSIS — F411 Generalized anxiety disorder: Secondary | ICD-10-CM | POA: Diagnosis not present

## 2019-02-21 NOTE — Progress Notes (Signed)
Carelink Summary Report / Loop Recorder 

## 2019-03-08 DIAGNOSIS — L410 Pityriasis lichenoides et varioliformis acuta: Secondary | ICD-10-CM | POA: Diagnosis not present

## 2019-03-08 DIAGNOSIS — B351 Tinea unguium: Secondary | ICD-10-CM | POA: Diagnosis not present

## 2019-03-08 DIAGNOSIS — L608 Other nail disorders: Secondary | ICD-10-CM | POA: Diagnosis not present

## 2019-03-16 ENCOUNTER — Ambulatory Visit (INDEPENDENT_AMBULATORY_CARE_PROVIDER_SITE_OTHER): Payer: BC Managed Care – PPO | Admitting: *Deleted

## 2019-03-16 DIAGNOSIS — I4891 Unspecified atrial fibrillation: Secondary | ICD-10-CM

## 2019-03-16 DIAGNOSIS — R002 Palpitations: Secondary | ICD-10-CM

## 2019-03-16 LAB — CUP PACEART REMOTE DEVICE CHECK
Date Time Interrogation Session: 20200723170939
Implantable Pulse Generator Implant Date: 20181112

## 2019-03-27 NOTE — Progress Notes (Signed)
Carelink Summary Report / Loop Recorder 

## 2019-03-31 ENCOUNTER — Other Ambulatory Visit: Payer: Self-pay

## 2019-03-31 ENCOUNTER — Ambulatory Visit
Admission: RE | Admit: 2019-03-31 | Discharge: 2019-03-31 | Disposition: A | Discharge status: Home / Self Care | Payer: BC Managed Care – PPO | Source: Ambulatory Visit | Attending: Obstetrics and Gynecology | Admitting: Obstetrics and Gynecology

## 2019-03-31 ENCOUNTER — Other Ambulatory Visit: Payer: Self-pay | Admitting: Obstetrics and Gynecology

## 2019-03-31 DIAGNOSIS — N6001 Solitary cyst of right breast: Secondary | ICD-10-CM

## 2019-04-12 ENCOUNTER — Other Ambulatory Visit: Payer: BLUE CROSS/BLUE SHIELD

## 2019-04-18 ENCOUNTER — Ambulatory Visit (INDEPENDENT_AMBULATORY_CARE_PROVIDER_SITE_OTHER): Payer: BC Managed Care – PPO | Admitting: *Deleted

## 2019-04-18 DIAGNOSIS — I480 Paroxysmal atrial fibrillation: Secondary | ICD-10-CM | POA: Diagnosis not present

## 2019-04-18 LAB — CUP PACEART REMOTE DEVICE CHECK
Date Time Interrogation Session: 20200825184028
Implantable Pulse Generator Implant Date: 20181112

## 2019-04-21 DIAGNOSIS — R7301 Impaired fasting glucose: Secondary | ICD-10-CM | POA: Diagnosis not present

## 2019-04-21 DIAGNOSIS — E559 Vitamin D deficiency, unspecified: Secondary | ICD-10-CM | POA: Diagnosis not present

## 2019-04-21 DIAGNOSIS — R5382 Chronic fatigue, unspecified: Secondary | ICD-10-CM | POA: Diagnosis not present

## 2019-04-25 NOTE — Progress Notes (Signed)
Carelink Summary Report / Loop Recorder 

## 2019-05-19 DIAGNOSIS — L905 Scar conditions and fibrosis of skin: Secondary | ICD-10-CM | POA: Diagnosis not present

## 2019-05-19 DIAGNOSIS — D2272 Melanocytic nevi of left lower limb, including hip: Secondary | ICD-10-CM | POA: Diagnosis not present

## 2019-05-19 DIAGNOSIS — Z85820 Personal history of malignant melanoma of skin: Secondary | ICD-10-CM | POA: Diagnosis not present

## 2019-05-19 DIAGNOSIS — D225 Melanocytic nevi of trunk: Secondary | ICD-10-CM | POA: Diagnosis not present

## 2019-05-22 ENCOUNTER — Ambulatory Visit (INDEPENDENT_AMBULATORY_CARE_PROVIDER_SITE_OTHER): Payer: BC Managed Care – PPO | Admitting: *Deleted

## 2019-05-22 DIAGNOSIS — I480 Paroxysmal atrial fibrillation: Secondary | ICD-10-CM

## 2019-05-23 LAB — CUP PACEART REMOTE DEVICE CHECK
Date Time Interrogation Session: 20200929125645
Implantable Pulse Generator Implant Date: 20181112

## 2019-05-25 DIAGNOSIS — M9901 Segmental and somatic dysfunction of cervical region: Secondary | ICD-10-CM | POA: Diagnosis not present

## 2019-05-25 DIAGNOSIS — M50322 Other cervical disc degeneration at C5-C6 level: Secondary | ICD-10-CM | POA: Diagnosis not present

## 2019-05-25 DIAGNOSIS — M9903 Segmental and somatic dysfunction of lumbar region: Secondary | ICD-10-CM | POA: Diagnosis not present

## 2019-05-25 DIAGNOSIS — M5386 Other specified dorsopathies, lumbar region: Secondary | ICD-10-CM | POA: Diagnosis not present

## 2019-05-31 NOTE — Progress Notes (Signed)
Carelink Summary Report / Loop Recorder 

## 2019-06-25 LAB — CUP PACEART REMOTE DEVICE CHECK
Date Time Interrogation Session: 20201101125448
Implantable Pulse Generator Implant Date: 20181112

## 2019-06-26 ENCOUNTER — Ambulatory Visit (INDEPENDENT_AMBULATORY_CARE_PROVIDER_SITE_OTHER): Payer: BC Managed Care – PPO | Admitting: *Deleted

## 2019-06-26 DIAGNOSIS — I4891 Unspecified atrial fibrillation: Secondary | ICD-10-CM | POA: Diagnosis not present

## 2019-07-03 DIAGNOSIS — M9903 Segmental and somatic dysfunction of lumbar region: Secondary | ICD-10-CM | POA: Diagnosis not present

## 2019-07-03 DIAGNOSIS — M9901 Segmental and somatic dysfunction of cervical region: Secondary | ICD-10-CM | POA: Diagnosis not present

## 2019-07-03 DIAGNOSIS — M5386 Other specified dorsopathies, lumbar region: Secondary | ICD-10-CM | POA: Diagnosis not present

## 2019-07-03 DIAGNOSIS — M50322 Other cervical disc degeneration at C5-C6 level: Secondary | ICD-10-CM | POA: Diagnosis not present

## 2019-07-05 DIAGNOSIS — M5386 Other specified dorsopathies, lumbar region: Secondary | ICD-10-CM | POA: Diagnosis not present

## 2019-07-05 DIAGNOSIS — M9903 Segmental and somatic dysfunction of lumbar region: Secondary | ICD-10-CM | POA: Diagnosis not present

## 2019-07-05 DIAGNOSIS — M50322 Other cervical disc degeneration at C5-C6 level: Secondary | ICD-10-CM | POA: Diagnosis not present

## 2019-07-05 DIAGNOSIS — M9901 Segmental and somatic dysfunction of cervical region: Secondary | ICD-10-CM | POA: Diagnosis not present

## 2019-07-10 DIAGNOSIS — M9901 Segmental and somatic dysfunction of cervical region: Secondary | ICD-10-CM | POA: Diagnosis not present

## 2019-07-10 DIAGNOSIS — M50322 Other cervical disc degeneration at C5-C6 level: Secondary | ICD-10-CM | POA: Diagnosis not present

## 2019-07-10 DIAGNOSIS — M5386 Other specified dorsopathies, lumbar region: Secondary | ICD-10-CM | POA: Diagnosis not present

## 2019-07-10 DIAGNOSIS — M9903 Segmental and somatic dysfunction of lumbar region: Secondary | ICD-10-CM | POA: Diagnosis not present

## 2019-07-12 ENCOUNTER — Other Ambulatory Visit: Payer: Self-pay

## 2019-07-12 DIAGNOSIS — Z20822 Contact with and (suspected) exposure to covid-19: Secondary | ICD-10-CM

## 2019-07-14 LAB — NOVEL CORONAVIRUS, NAA: SARS-CoV-2, NAA: NOT DETECTED

## 2019-07-18 NOTE — Progress Notes (Signed)
Carelink Summary Report / Loop Recorder 

## 2019-07-28 ENCOUNTER — Ambulatory Visit (INDEPENDENT_AMBULATORY_CARE_PROVIDER_SITE_OTHER): Payer: BC Managed Care – PPO | Admitting: *Deleted

## 2019-07-28 DIAGNOSIS — R002 Palpitations: Secondary | ICD-10-CM

## 2019-07-29 LAB — CUP PACEART REMOTE DEVICE CHECK
Date Time Interrogation Session: 20201204102544
Implantable Pulse Generator Implant Date: 20181112

## 2019-08-20 ENCOUNTER — Telehealth: Payer: Self-pay | Admitting: Gastroenterology

## 2019-08-20 DIAGNOSIS — R1033 Periumbilical pain: Secondary | ICD-10-CM

## 2019-08-20 MED ORDER — OMEPRAZOLE 40 MG PO CPDR: 40.0000 mg | DELAYED_RELEASE_CAPSULE | Freq: Every day | ORAL | 11 refills | Status: DC

## 2019-08-20 NOTE — Telephone Encounter (Signed)
Isaac is a friend of mine.  I talked with her today of the phone this afternoon. She's had 2 weeks of post-prandial, colicky pain located behind her umbilicus. + associated nausea.  She takes a decent amount of NSAIDs for headaches, menstral cycle pains.  800mg  ibuprofen 2-3 times per week.  No GERD, not on any antiacid meds.  + 10 pound weight gain in the past 5-6 months.  GB in places, her appendix was removed in college.  Patty, Can you contact her Monday AM (her mobile number is correct in Epic) about labs (cbc, cmet, lipase, UA with reflex culture) as well as abdominal US in the next couple days.  Her husband is Lars Pinks, a radiologist through [employer].  Would expect they can get an US done for her by Tuesday.    She is going to stop NSAIDs for now, take tylenol for pains.  I called in omeprazole 40mg  pills once daily to her pharmacy just now and she will start that today or tomorrow.  She knows that she may need further testing if the above tests are not helpful and/or if her pains do not improve on the omeprazole.    Thanks DJ

## 2019-08-21 ENCOUNTER — Ambulatory Visit
Admission: RE | Admit: 2019-08-21 | Discharge: 2019-08-21 | Disposition: A | Discharge status: Home / Self Care | Payer: BC Managed Care – PPO | Source: Ambulatory Visit | Attending: Gastroenterology | Admitting: Gastroenterology

## 2019-08-21 ENCOUNTER — Other Ambulatory Visit (INDEPENDENT_AMBULATORY_CARE_PROVIDER_SITE_OTHER): Payer: BC Managed Care – PPO

## 2019-08-21 DIAGNOSIS — R1033 Periumbilical pain: Secondary | ICD-10-CM

## 2019-08-21 LAB — COMPREHENSIVE METABOLIC PANEL
ALT: 12 U/L (ref 0–35)
AST: 14 U/L (ref 0–37)
Albumin: 4.2 g/dL (ref 3.5–5.2)
Alkaline Phosphatase: 36 U/L — ABNORMAL LOW (ref 39–117)
BUN: 15 mg/dL (ref 6–23)
CO2: 28 mEq/L (ref 19–32)
Calcium: 8.8 mg/dL (ref 8.4–10.5)
Chloride: 102 mEq/L (ref 96–112)
Creatinine, Ser: 0.74 mg/dL (ref 0.40–1.20)
GFR: 83.97 mL/min (ref >60.00)
Glucose, Bld: 93 mg/dL (ref 70–99)
Potassium: 3.8 mEq/L (ref 3.5–5.1)
Sodium: 136 mEq/L (ref 135–145)
Total Bilirubin: 0.5 mg/dL (ref 0.2–1.2)
Total Protein: 6.7 g/dL (ref 6.0–8.3)

## 2019-08-21 LAB — URINALYSIS, ROUTINE W REFLEX MICROSCOPIC
Bilirubin Urine: NEGATIVE
Hgb urine dipstick: NEGATIVE
Ketones, ur: NEGATIVE
Leukocytes,Ua: NEGATIVE
Nitrite: NEGATIVE
RBC / HPF: NONE SEEN (ref 0–?)
Specific Gravity, Urine: 1.025 (ref 1.000–1.030)
Total Protein, Urine: NEGATIVE
Urine Glucose: NEGATIVE
Urobilinogen, UA: 0.2 (ref 0.0–1.0)
pH: 6 (ref 5.0–8.0)

## 2019-08-21 LAB — CBC WITH DIFFERENTIAL/PLATELET
Basophils Absolute: 0.1 10*3/uL (ref 0.0–0.1)
Basophils Relative: 0.8 % (ref 0.0–3.0)
Eosinophils Absolute: 0.3 10*3/uL (ref 0.0–0.7)
Eosinophils Relative: 3.8 % (ref 0.0–5.0)
HCT: 42.1 % (ref 36.0–46.0)
Hemoglobin: 14 g/dL (ref 12.0–15.0)
Lymphocytes Relative: 30.4 % (ref 12.0–46.0)
Lymphs Abs: 2.6 10*3/uL (ref 0.7–4.0)
MCHC: 33.2 g/dL (ref 30.0–36.0)
MCV: 89.3 fl (ref 78.0–100.0)
Monocytes Absolute: 0.8 10*3/uL (ref 0.1–1.0)
Monocytes Relative: 8.8 % (ref 3.0–12.0)
Neutro Abs: 4.9 10*3/uL (ref 1.4–7.7)
Neutrophils Relative %: 56.2 % (ref 43.0–77.0)
Platelets: 244 10*3/uL (ref 150.0–400.0)
RBC: 4.72 Mil/uL (ref 3.87–5.11)
RDW: 12.9 % (ref 11.5–15.5)
WBC: 8.6 10*3/uL (ref 4.0–10.5)

## 2019-08-21 LAB — LIPASE: Lipase: 15 U/L (ref 11.0–59.0)

## 2019-08-21 NOTE — Telephone Encounter (Signed)
Lab order in [place] have been scheduled for an abdominal ultrasound at [facility] on TODAY at 2 pm. Please arrive 15 minutes prior to your appointment for registration. Make certain not to have anything to eat or drink 6 hours prior to your appointment. Should you need to reschedule your appointment, please contact radiology at [phone]. This test typically takes about 30 minutes to perform.   The pt has been advised and will come in for labs and [place]

## 2019-08-30 ENCOUNTER — Ambulatory Visit (INDEPENDENT_AMBULATORY_CARE_PROVIDER_SITE_OTHER): Payer: BC Managed Care – PPO | Admitting: *Deleted

## 2019-08-30 DIAGNOSIS — I480 Paroxysmal atrial fibrillation: Secondary | ICD-10-CM

## 2019-08-31 LAB — CUP PACEART REMOTE DEVICE CHECK
Date Time Interrogation Session: 20210106102208
Implantable Pulse Generator Implant Date: 20181112

## 2019-09-06 ENCOUNTER — Encounter: Payer: BC Managed Care – PPO | Admitting: Gastroenterology

## 2019-09-18 ENCOUNTER — Encounter: Payer: BC Managed Care – PPO | Admitting: Gastroenterology

## 2019-09-26 ENCOUNTER — Telehealth: Payer: Self-pay

## 2019-09-27 ENCOUNTER — Encounter: Payer: Self-pay | Admitting: Internal Medicine

## 2019-09-27 ENCOUNTER — Telehealth (INDEPENDENT_AMBULATORY_CARE_PROVIDER_SITE_OTHER): Payer: BC Managed Care – PPO | Admitting: Internal Medicine

## 2019-09-27 VITALS — Ht 66.0 in | Wt 145.0 lb

## 2019-09-27 DIAGNOSIS — I480 Paroxysmal atrial fibrillation: Secondary | ICD-10-CM | POA: Diagnosis not present

## 2019-09-27 DIAGNOSIS — R002 Palpitations: Secondary | ICD-10-CM

## 2019-09-27 NOTE — Progress Notes (Signed)
Electrophysiology TeleHealth Note   Due to national recommendations of social distancing due to COVID 19, an audio/video telehealth visit is felt to be most appropriate for this patient at this time.  See MyChart message from today for the patient's consent to telehealth for [facility].  Date:  09/27/2019   ID:  [name], DOB 1971-07-22, MRN [id number]  Location: patient's home  Provider location:  [facility]  Evaluation Performed: Follow-up visit  PCP:  [name], MD   Electrophysiologist:  Dr Rayann Heman  Chief Complaint:  palpitations  History of Present Illness:    [name] is a 49 y.o. female who presents via telehealth conferencing today.  Since last being seen in our clinic, the patient reports doing very well.  Her daughter is 65 and will be going to [place] next fall.  She has 3 sons also.  The family has adjusted to the pandemic and have a new puppy.  She also has a peleton which she has not yet begun to use.  Today, she denies symptoms of palpitations, chest pain, shortness of breath,  lower extremity edema, dizziness, presyncope, or syncope.  The patient is otherwise without complaint today.  The patient denies symptoms of fevers, chills, cough, or new SOB worrisome for COVID 19.  Past Medical History:  Diagnosis Date  . Anxiety   . Atrial fibrillation ([place])    adm 01/2014  . Atrial fibrillation with RVR ([place])   . Depression   . Diabetes mellitus without complication ([place])    controlled with diet  . Insomnia   . OCD (obsessive compulsive disorder)   . SVT (supraventricular tachycardia) ([facility])     Past Surgical History:  Procedure Laterality Date  . APPENDECTOMY    . CESAREAN SECTION  2007  . DIAGNOSTIC LAPAROSCOPY    . ELECTROPHYSIOLOGIC STUDY N/A 03/26/2015   PVI by Dr Rayann Heman for afib  . LAPAROSCOPY  10/30/2011   Procedure: LAPAROSCOPY DIAGNOSTIC;  Surgeon: Lovenia Kim, MD;  Location: [facility];  Service: Gynecology;  Laterality: N/A;  . LOOP  RECORDER INSERTION N/A 07/05/2017   Procedure: LOOP RECORDER INSERTION;  Surgeon: [name], MD;  Location: [facility];  Service: Cardiovascular;  Laterality: N/A;  . TEE WITHOUT CARDIOVERSION N/A 03/25/2015   Procedure: TRANSESOPHAGEAL ECHOCARDIOGRAM (TEE);  Surgeon: Thayer Headings, MD;  Location: [facility];  Service: Cardiovascular;  Laterality: N/A;    Current Outpatient Medications  Medication Sig Dispense Refill  . BuPROPion HBr 174 MG TB24 Take 1 tablet by mouth daily.    . flecainide (TAMBOCOR) 100 MG tablet Take 1-3 tablets by mouth as needed at onset of fast heart rate(afib) 12 tablet 3  . methylphenidate 10 MG ER tablet Take 10 mg by mouth daily.    . Multiple Vitamins-Minerals (MULTIVITAMIN WITH MINERALS) tablet Take 1 tablet by mouth daily.    . nadolol (CORGARD) 20 MG tablet Take 0.5 tablets (10 mg total) by mouth daily. 45 tablet 3  . omeprazole (PRILOSEC) 40 MG capsule Take 1 capsule (40 mg total) by mouth daily before breakfast. 30 capsule 11  . QUEtiapine (SEROQUEL) 100 MG tablet Take 1 tablet by mouth as needed for sleep.    Marland Kitchen venlafaxine XR (EFFEXOR-XR) 75 MG 24 hr capsule Take 75 mg by mouth daily.   0   No current facility-administered medications for this visit.    Allergies:   Patient has no known allergies.   Social History:  The patient  reports  that she has never smoked. She has never used smokeless tobacco. She reports current alcohol use. She reports that she does not use drugs.   Family History:  The patient's family history includes COPD in her mother; Heart attack in her father.   ROS:  Please see the history of present illness.   All other systems are personally reviewed and negative.    Exam:    Vital Signs:  Ht 5\' 6"  (1.676 m)   Wt 145 lb (65.8 kg)   BMI 23.40 kg/m   Well sounding and appearing, alert and conversant, regular work of breathing,  good skin color Eyes- anicteric, neuro- grossly intact, skin- no apparent rash or lesions  or cyanosis, mouth- oral mucosa is pink  Labs/Other Tests and Data Reviewed:    Recent Labs: 08/21/2019: ALT 12; BUN 15; Creatinine, Ser 0.74; Hemoglobin 14.0; Platelets 244.0; Potassium 3.8; Sodium 136   Wt Readings from Last 3 Encounters:  09/27/19 145 lb (65.8 kg)  10/03/18 139 lb (63 kg)  08/30/17 155 lb 12.8 oz (70.7 kg)     Last device remote is reviewed from [facility] PDF which reveals normal device function, no arrhythmias    ASSESSMENT & PLAN:    1.  Paroxysmal atrial fibrillation Well controlled post ablation ILR reveals afib burden of 0 % chads32vasc score is 1.  She does not require [place] at this time She has prn flecainide for afib episodes but has not had to use this  2. Sinus tachycardia Stable with nadolol prn.  She takes it about once per week No change required today    Follow-up:  12 months with me   Patient Risk:  after full review of this patients clinical status, I feel that they are at moderate risk at this time.  Today, I have spent 15 minutes with the patient with telehealth technology discussing arrhythmia management .    Army Fossa, MD  09/27/2019 11:14 AM     [facility] [address] [place] [address] [phone] (office) [phone] (fax)

## 2019-10-02 ENCOUNTER — Ambulatory Visit (INDEPENDENT_AMBULATORY_CARE_PROVIDER_SITE_OTHER): Payer: BC Managed Care – PPO | Admitting: *Deleted

## 2019-10-02 DIAGNOSIS — I480 Paroxysmal atrial fibrillation: Secondary | ICD-10-CM | POA: Diagnosis not present

## 2019-10-02 LAB — CUP PACEART REMOTE DEVICE CHECK
Date Time Interrogation Session: 20210208000925
Implantable Pulse Generator Implant Date: 20181112

## 2019-10-03 NOTE — Progress Notes (Signed)
ILR Remote 

## 2019-10-04 ENCOUNTER — Ambulatory Visit
Admission: RE | Admit: 2019-10-04 | Discharge: 2019-10-04 | Disposition: A | Discharge status: Home / Self Care | Payer: BC Managed Care – PPO | Source: Ambulatory Visit | Attending: Obstetrics and Gynecology | Admitting: Obstetrics and Gynecology

## 2019-10-04 ENCOUNTER — Other Ambulatory Visit: Payer: Self-pay

## 2019-10-04 DIAGNOSIS — N6001 Solitary cyst of right breast: Secondary | ICD-10-CM

## 2019-10-14 ENCOUNTER — Other Ambulatory Visit: Payer: Self-pay | Admitting: Internal Medicine

## 2019-11-02 ENCOUNTER — Ambulatory Visit (INDEPENDENT_AMBULATORY_CARE_PROVIDER_SITE_OTHER): Payer: BC Managed Care – PPO | Admitting: *Deleted

## 2019-11-02 DIAGNOSIS — I480 Paroxysmal atrial fibrillation: Secondary | ICD-10-CM | POA: Diagnosis not present

## 2019-11-02 LAB — CUP PACEART REMOTE DEVICE CHECK
Date Time Interrogation Session: 20210311005228
Implantable Pulse Generator Implant Date: 20181112

## 2019-11-02 NOTE — Progress Notes (Signed)
ILR Remote 

## 2019-12-03 LAB — CUP PACEART REMOTE DEVICE CHECK
Date Time Interrogation Session: 20210411034937
Implantable Pulse Generator Implant Date: 20181112

## 2019-12-04 ENCOUNTER — Ambulatory Visit (INDEPENDENT_AMBULATORY_CARE_PROVIDER_SITE_OTHER): Payer: BC Managed Care – PPO | Admitting: *Deleted

## 2019-12-04 DIAGNOSIS — I480 Paroxysmal atrial fibrillation: Secondary | ICD-10-CM

## 2019-12-04 NOTE — Progress Notes (Signed)
ILR Remote 

## 2019-12-05 DIAGNOSIS — D2272 Melanocytic nevi of left lower limb, including hip: Secondary | ICD-10-CM | POA: Diagnosis not present

## 2019-12-05 DIAGNOSIS — D2271 Melanocytic nevi of right lower limb, including hip: Secondary | ICD-10-CM | POA: Diagnosis not present

## 2019-12-05 DIAGNOSIS — Z85820 Personal history of malignant melanoma of skin: Secondary | ICD-10-CM | POA: Diagnosis not present

## 2019-12-05 DIAGNOSIS — L814 Other melanin hyperpigmentation: Secondary | ICD-10-CM | POA: Diagnosis not present

## 2019-12-27 DIAGNOSIS — F429 Obsessive-compulsive disorder, unspecified: Secondary | ICD-10-CM | POA: Diagnosis not present

## 2019-12-27 DIAGNOSIS — F411 Generalized anxiety disorder: Secondary | ICD-10-CM | POA: Diagnosis not present

## 2019-12-27 DIAGNOSIS — F900 Attention-deficit hyperactivity disorder, predominantly inattentive type: Secondary | ICD-10-CM | POA: Diagnosis not present

## 2020-01-02 ENCOUNTER — Telehealth: Payer: Self-pay

## 2020-01-02 NOTE — Telephone Encounter (Signed)
Spoke with pt, advised NSR a little fast but that is her baseline.  No episodes.

## 2020-01-02 NOTE — Telephone Encounter (Signed)
Patient called stating that she sent a transmission because her heart feels out of rhythm and her head hurts and she is concerned.

## 2020-01-04 LAB — CUP PACEART REMOTE DEVICE CHECK
Date Time Interrogation Session: 20210512035316
Implantable Pulse Generator Implant Date: 20181112

## 2020-01-08 ENCOUNTER — Ambulatory Visit (INDEPENDENT_AMBULATORY_CARE_PROVIDER_SITE_OTHER): Payer: BC Managed Care – PPO | Admitting: *Deleted

## 2020-01-08 DIAGNOSIS — R002 Palpitations: Secondary | ICD-10-CM

## 2020-01-09 NOTE — Progress Notes (Signed)
Carelink Summary Report / Loop Recorder 

## 2020-01-15 DIAGNOSIS — B029 Zoster without complications: Secondary | ICD-10-CM | POA: Diagnosis not present

## 2020-02-08 DIAGNOSIS — Z20822 Contact with and (suspected) exposure to covid-19: Secondary | ICD-10-CM | POA: Diagnosis not present

## 2020-02-08 DIAGNOSIS — Z03818 Encounter for observation for suspected exposure to other biological agents ruled out: Secondary | ICD-10-CM | POA: Diagnosis not present

## 2020-02-12 ENCOUNTER — Ambulatory Visit (INDEPENDENT_AMBULATORY_CARE_PROVIDER_SITE_OTHER): Payer: BC Managed Care – PPO | Admitting: *Deleted

## 2020-02-12 DIAGNOSIS — R002 Palpitations: Secondary | ICD-10-CM | POA: Diagnosis not present

## 2020-02-12 LAB — CUP PACEART REMOTE DEVICE CHECK
Date Time Interrogation Session: 20210621004142
Implantable Pulse Generator Implant Date: 20181112

## 2020-02-13 NOTE — Progress Notes (Signed)
Carelink Summary Report / Loop Recorder 

## 2020-03-18 ENCOUNTER — Ambulatory Visit (INDEPENDENT_AMBULATORY_CARE_PROVIDER_SITE_OTHER): Payer: BC Managed Care – PPO | Admitting: *Deleted

## 2020-03-18 DIAGNOSIS — R002 Palpitations: Secondary | ICD-10-CM

## 2020-03-18 LAB — CUP PACEART REMOTE DEVICE CHECK
Date Time Interrogation Session: 20210725232710
Implantable Pulse Generator Implant Date: 20181112

## 2020-03-19 NOTE — Progress Notes (Signed)
Carelink Summary Report / Loop Recorder 

## 2020-04-09 DIAGNOSIS — F900 Attention-deficit hyperactivity disorder, predominantly inattentive type: Secondary | ICD-10-CM | POA: Diagnosis not present

## 2020-04-09 DIAGNOSIS — F411 Generalized anxiety disorder: Secondary | ICD-10-CM | POA: Diagnosis not present

## 2020-04-09 DIAGNOSIS — F429 Obsessive-compulsive disorder, unspecified: Secondary | ICD-10-CM | POA: Diagnosis not present

## 2020-04-19 LAB — CUP PACEART REMOTE DEVICE CHECK
Date Time Interrogation Session: 20210825234456
Implantable Pulse Generator Implant Date: 20181112

## 2020-04-22 ENCOUNTER — Ambulatory Visit (INDEPENDENT_AMBULATORY_CARE_PROVIDER_SITE_OTHER): Payer: BC Managed Care – PPO | Admitting: *Deleted

## 2020-04-22 DIAGNOSIS — R002 Palpitations: Secondary | ICD-10-CM | POA: Diagnosis not present

## 2020-04-23 NOTE — Progress Notes (Signed)
Carelink Summary Report / Loop Recorder 

## 2020-04-27 DIAGNOSIS — Z20822 Contact with and (suspected) exposure to covid-19: Secondary | ICD-10-CM | POA: Diagnosis not present

## 2020-04-29 ENCOUNTER — Encounter (INDEPENDENT_AMBULATORY_CARE_PROVIDER_SITE_OTHER): Payer: Self-pay

## 2020-04-29 ENCOUNTER — Telehealth: Payer: BC Managed Care – PPO | Admitting: Family

## 2020-04-29 DIAGNOSIS — U071 COVID-19: Secondary | ICD-10-CM

## 2020-04-29 MED ORDER — DEXAMETHASONE 1.5 MG (21) PO TBPK: 1.0000 | ORAL_TABLET | Freq: Every day | ORAL | 0 refills | Status: DC

## 2020-04-29 MED ORDER — ALBUTEROL SULFATE HFA 108 (90 BASE) MCG/ACT IN AERS: 2.0000 | INHALATION_SPRAY | Freq: Four times a day (QID) | RESPIRATORY_TRACT | 0 refills | Status: DC | PRN

## 2020-04-29 MED ORDER — BENZONATATE 100 MG PO CAPS: 100.0000 mg | ORAL_CAPSULE | Freq: Three times a day (TID) | ORAL | 0 refills | Status: DC | PRN

## 2020-04-29 NOTE — Progress Notes (Signed)
E-Visit for Corona Virus Screening  We are sorry you are not feeling well. We are here to help!  You have tested positive for COVID-19, meaning that you were infected with the novel coronavirus and could give the germ to others.    You have been enrolled in [facility] for COVID-19. Daily you will receive a questionnaire within the [facility] website. Our COVID-19 response team will be monitoring your responses daily.  Please continue isolation at home, for at least 10 days since the start of your symptoms and until you have had 24 hours with no fever (without taking a fever reducer) and with improving of symptoms.  Please continue good preventive care measures, including:  frequent hand-washing, avoid touching your face, cover coughs/sneezes, stay out of crowds and keep a 6 foot distance from others.  Follow up with your provider or go to the nearest hospital ED for re-assessment if fever/cough/breathlessness return.  The following symptoms may appear 2-14 days after exposure: . Fever . Cough . Shortness of breath or difficulty breathing . Chills . Repeated shaking with chills . Muscle pain . Headache . Sore throat . New loss of taste or smell . Fatigue . Congestion or runny nose . Nausea or vomiting . Diarrhea  Go to the nearest hospital ED for assessment if fever/cough/breathlessness are severe or illness seems like a threat to life.  It is vitally important that if you feel that you have an infection such as this virus or any other virus that you stay home and away from places where you may spread it to others.  You should avoid contact with people age 24 and older.   You can use medication such as A prescription cough medication called Tessalon Perles 100 mg. You may take 1-2 capsules every 8 hours as needed for cough and A prescription inhaler called Albuterol MDI 90 mcg /actuation 2 puffs every 4 hours as needed for shortness of breath, wheezing, cough and dexamethasone  dose pack.   You may also take acetaminophen (Tylenol) as needed for fever.  Reduce your risk of any infection by using the same precautions used for avoiding the common cold or flu:  Marland Kitchen Wash your hands often with soap and warm water for at least 20 seconds.  If soap and water are not readily available, use an alcohol-based hand sanitizer with at least 60% alcohol.  . If coughing or sneezing, cover your mouth and nose by coughing or sneezing into the elbow areas of your shirt or coat, into a tissue or into your sleeve (not your hands). . Avoid shaking hands with others and consider head nods or verbal greetings only. . Avoid touching your eyes, nose, or mouth with unwashed hands.  . Avoid close contact with people who are sick. . Avoid places or events with large numbers of people in one location, like concerts or sporting events. . Carefully consider travel plans you have or are making. . If you are planning any travel outside or inside the [place], visit the CDC's Travelers' Health webpage for the latest health notices. . If you have some symptoms but not all symptoms, continue to monitor at home and seek medical attention if your symptoms worsen. . If you are having a medical emergency, call 911.  HOME CARE . Only take medications as instructed by your medical team. . Drink plenty of fluids and get plenty of rest. . A steam or ultrasonic humidifier can help if you have congestion.   GET HELP  RIGHT AWAY IF YOU HAVE EMERGENCY WARNING SIGNS** FOR COVID-19. If you or someone is showing any of these signs seek emergency medical care immediately. Call 911 or proceed to your closest emergency facility if: . You develop worsening high fever. . Trouble breathing . Bluish lips or face . Persistent pain or pressure in the chest . New confusion . Inability to wake or stay awake . You cough up blood. . Your symptoms become more severe  **This list is not all possible symptoms. Contact your medical  provider for any symptoms that are sever or concerning to you.  MAKE SURE YOU   Understand these instructions.  Will watch your condition.  Will get help right away if you are not doing well or get worse.  Your e-visit answers were reviewed by a board certified advanced clinical practitioner to complete your personal care plan.  Depending on the condition, your plan could have included both over the counter or prescription medications.  If there is a problem please reply once you have received a response from your provider.  Your safety is important to [place].  If you have drug allergies check your prescription carefully.    You can use MyChart to ask questions about today's visit, request a non-urgent call back, or ask for a work or school excuse for 24 hours related to this e-Visit. If it has been greater than 24 hours you will need to follow up with your provider, or enter a new e-Visit to address those concerns. You will get an e-mail in the next two days asking about your experience.  I hope that your e-visit has been valuable and will speed your recovery. Thank you for using e-visits.     Approximately 5 minutes was spent documenting and reviewing patient's chart.

## 2020-05-20 ENCOUNTER — Ambulatory Visit (INDEPENDENT_AMBULATORY_CARE_PROVIDER_SITE_OTHER): Payer: BC Managed Care – PPO | Admitting: Emergency Medicine

## 2020-05-20 DIAGNOSIS — R002 Palpitations: Secondary | ICD-10-CM

## 2020-05-20 LAB — CUP PACEART REMOTE DEVICE CHECK
Date Time Interrogation Session: 20210925234409
Implantable Pulse Generator Implant Date: 20181112

## 2020-05-21 DIAGNOSIS — D225 Melanocytic nevi of trunk: Secondary | ICD-10-CM | POA: Diagnosis not present

## 2020-05-21 DIAGNOSIS — L738 Other specified follicular disorders: Secondary | ICD-10-CM | POA: Diagnosis not present

## 2020-05-21 DIAGNOSIS — Z85820 Personal history of malignant melanoma of skin: Secondary | ICD-10-CM | POA: Diagnosis not present

## 2020-05-21 DIAGNOSIS — L821 Other seborrheic keratosis: Secondary | ICD-10-CM | POA: Diagnosis not present

## 2020-05-22 NOTE — Progress Notes (Signed)
Carelink Summary Report / Loop Recorder 

## 2020-05-28 NOTE — Addendum Note (Signed)
Addended by: Douglass Rivers D on: 05/28/2020 08:36 AM   Modules accepted: Level of Service

## 2020-06-19 LAB — CUP PACEART REMOTE DEVICE CHECK
Date Time Interrogation Session: 20211026234755
Implantable Pulse Generator Implant Date: 20181112

## 2020-06-19 IMAGING — US ULTRASOUND RIGHT BREAST LIMITED
1 series · 8 of 8 positions shown · non-contrast
Comparison: Previous exam(s).

CLINICAL DATA: 47-year-old female recalled from screening mammogram
dated 09/30/2018 for a possible right breast mass.

EXAM:
DIGITAL DIAGNOSTIC RIGHT MAMMOGRAM WITH CAD AND TOMO
ULTRASOUND RIGHT BREAST

[Series 1: ultrasound right breast limited · 0.06mm/px · 8 of 8 slices shown]
[im 1/8]
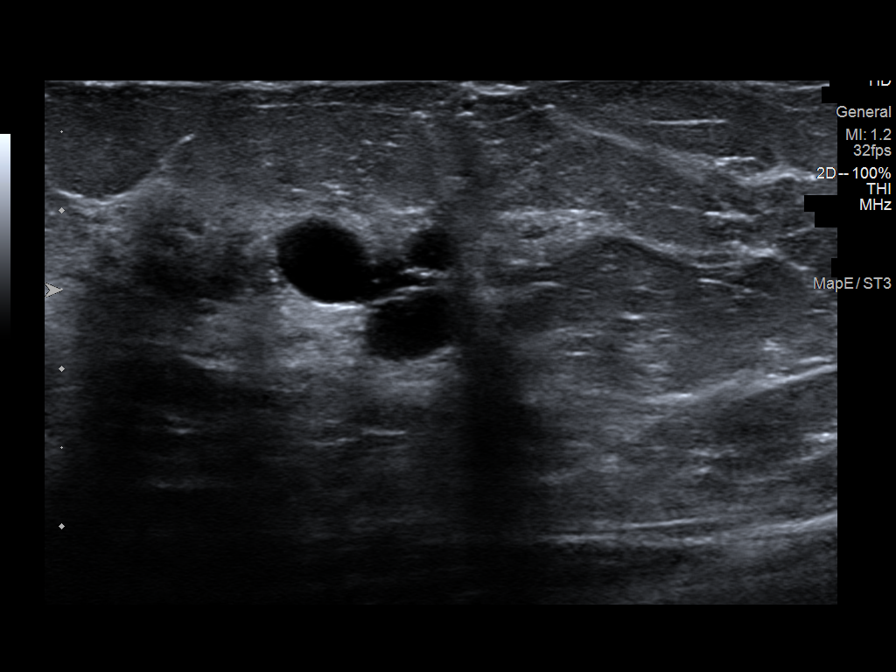
[im 2/8]
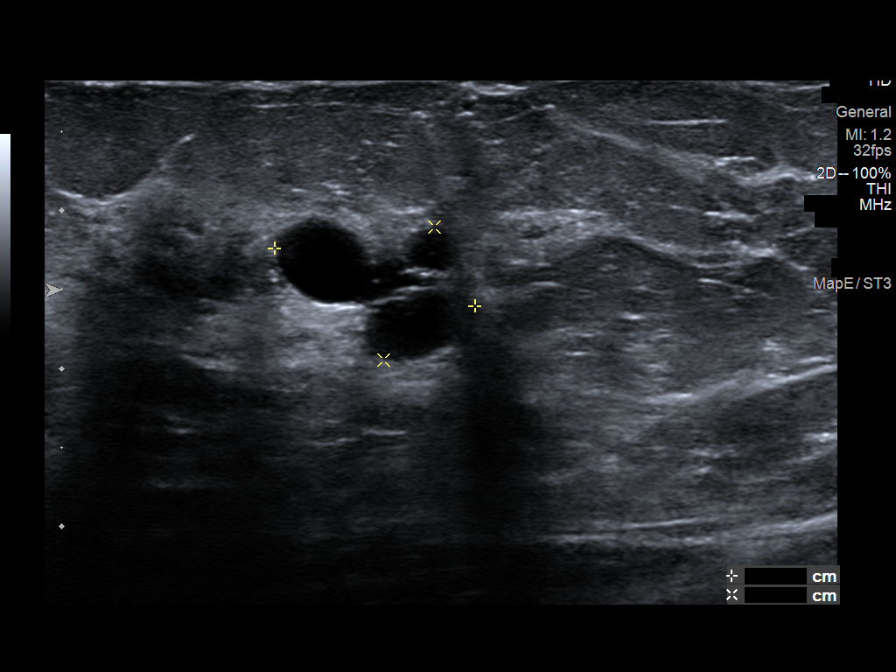
[im 3/8]
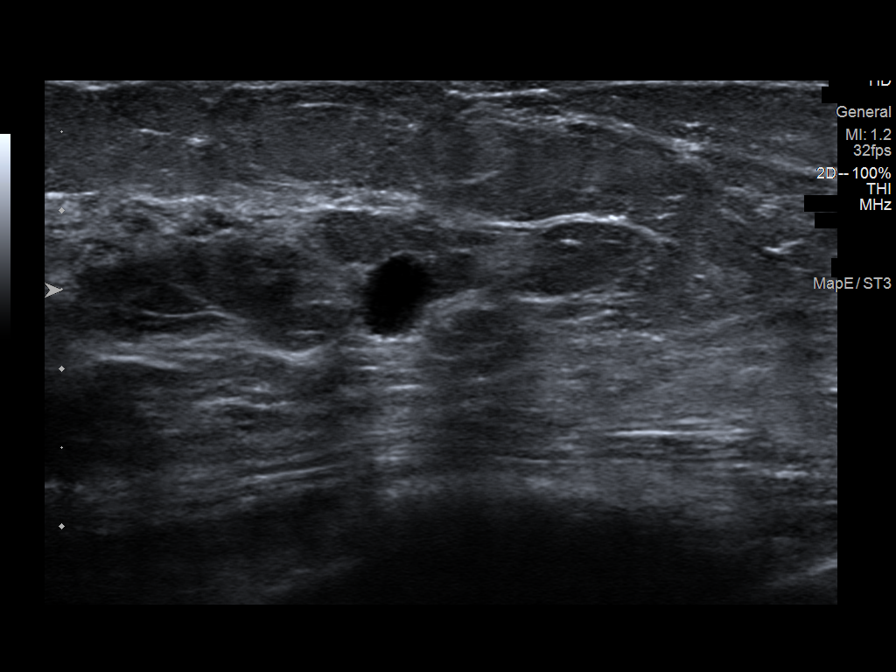
[im 4/8]
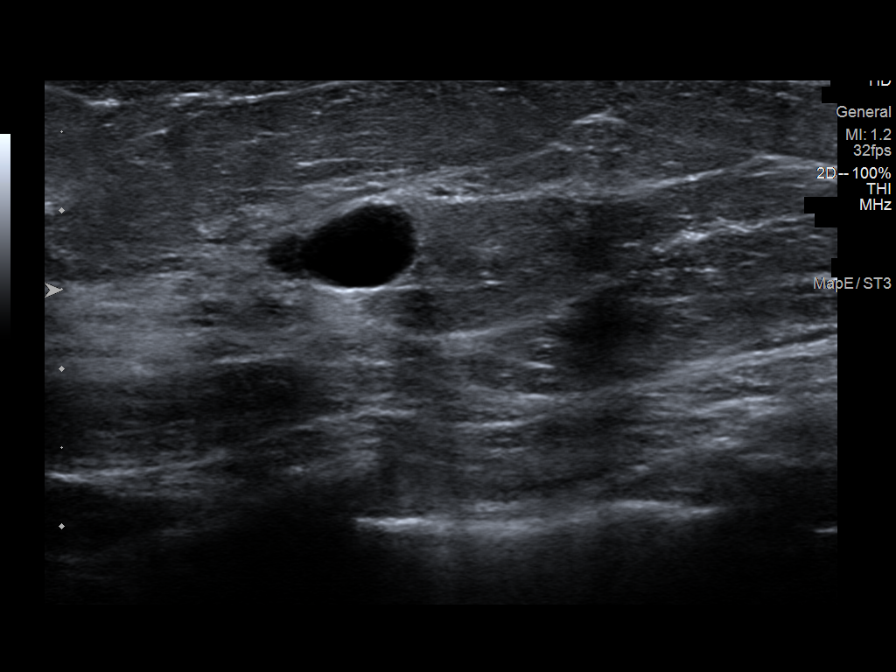
[im 5/8]
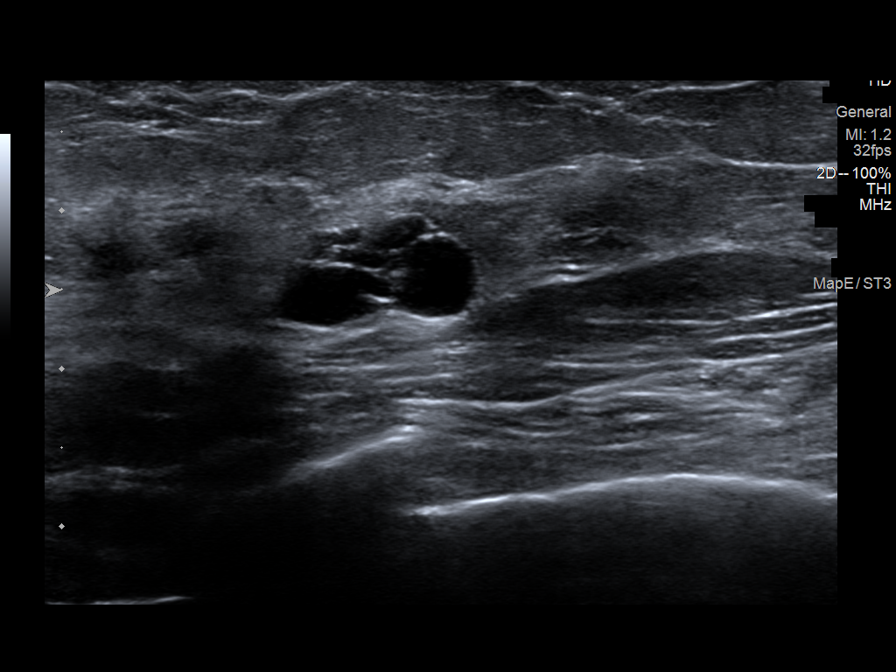
[im 6/8]
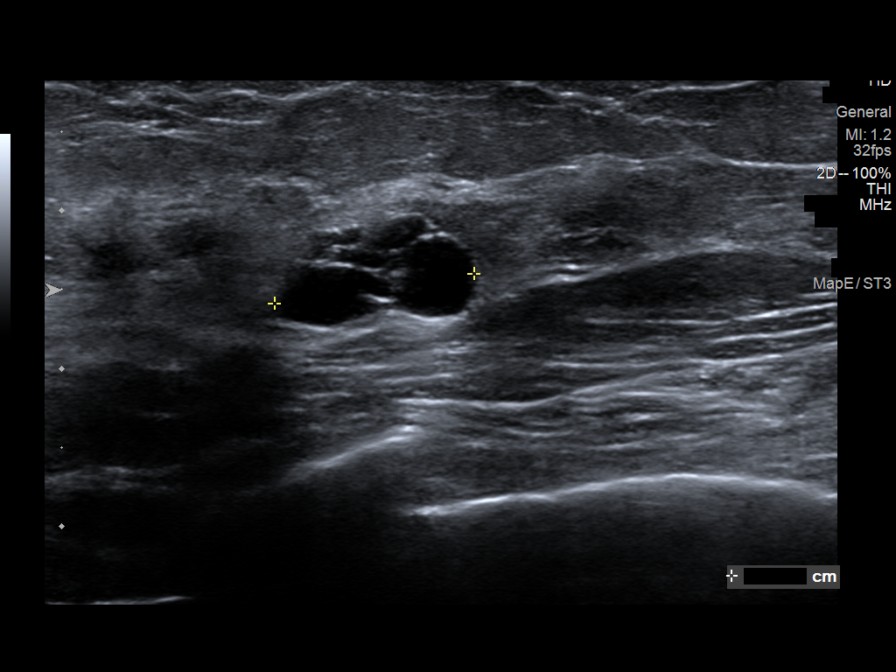
[im 7/8]
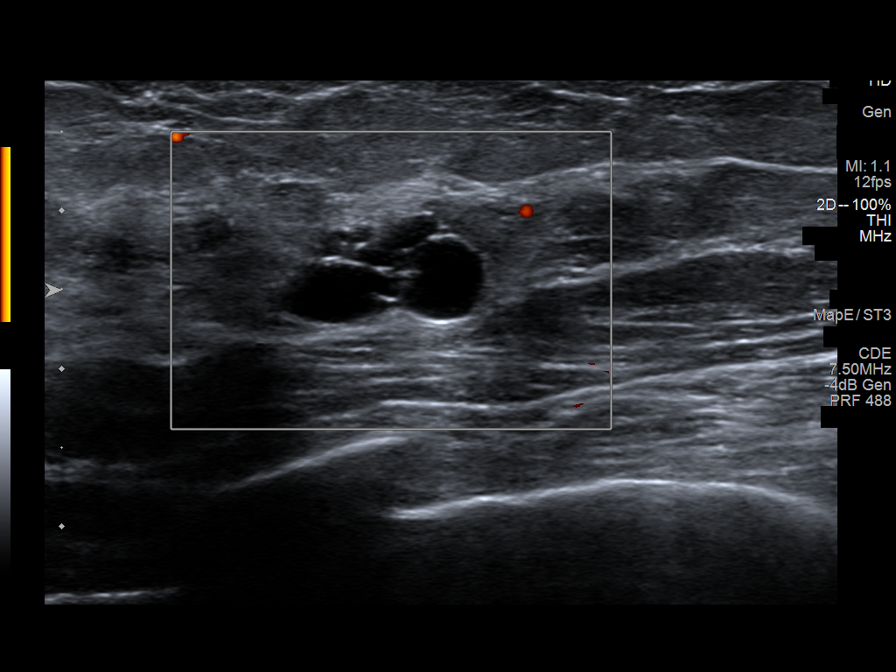
[im 8/8]
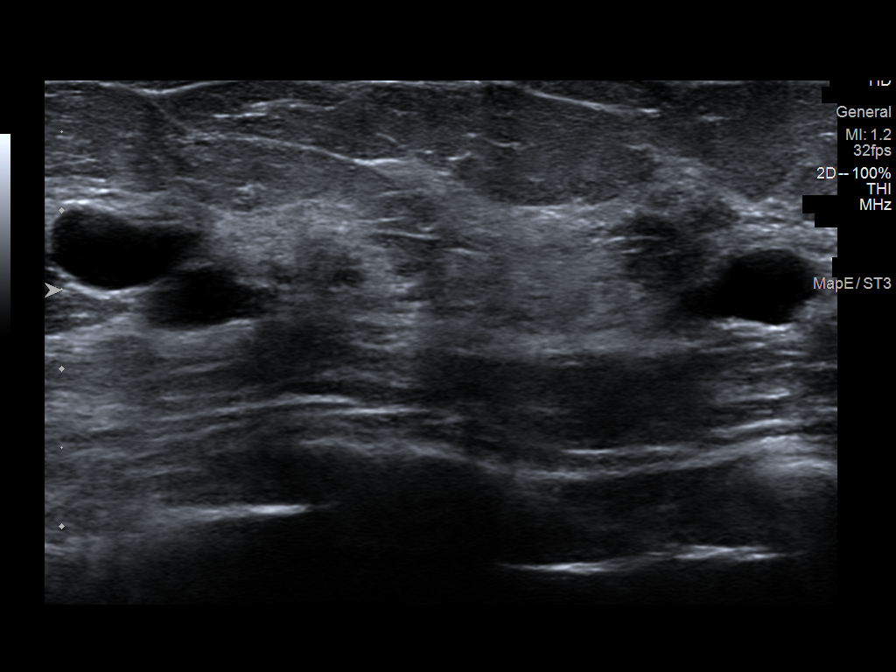

[8 of 8 positions shown; findings below may reference images not displayed]

ACR Breast Density Category c: The breast tissue is heterogeneously
dense, which may obscure small masses.
FINDINGS: There is a persistent, multilobulated circumscribed mass in the
central medial right breast at middle depth. Further evaluation with
ultrasound was performed.

Mammographic images were processed with CAD.

Targeted ultrasound is performed, showing a circumscribed
multi-cystic mass at the 2 o'clock position 3 cm from the nipple.
Overall measurements are 1.3 x 1.3 x 0.9 cm. There is posterior
acoustic enhancement and no associated vascularity. This correlates
well with the mammographic finding and is most consistent with a
cluster of cysts.

Incidental note is made of other benign simple cysts scattered
throughout the right breast.
IMPRESSION: Probably benign cyst cluster at the 2 o'clock position 3 cm from the
nipple on the right corresponding with the screening mammographic
findings. Recommendation is for precautionary short-term ultrasound
follow-up.

RECOMMENDATION:
Right breast ultrasound in 6 months.

I have discussed the findings and recommendations with the patient.
Results were also provided in writing at the conclusion of the
visit. If applicable, a reminder letter will be sent to the patient
regarding the next appointment.

BI-RADS CATEGORY  3: Probably benign.

## 2020-06-20 ENCOUNTER — Ambulatory Visit (INDEPENDENT_AMBULATORY_CARE_PROVIDER_SITE_OTHER): Payer: BC Managed Care – PPO

## 2020-06-20 DIAGNOSIS — R002 Palpitations: Secondary | ICD-10-CM | POA: Diagnosis not present

## 2020-06-25 NOTE — Progress Notes (Signed)
Carelink Summary Report / Loop Recorder 

## 2020-07-22 ENCOUNTER — Ambulatory Visit (INDEPENDENT_AMBULATORY_CARE_PROVIDER_SITE_OTHER): Payer: BC Managed Care – PPO

## 2020-07-22 DIAGNOSIS — R002 Palpitations: Secondary | ICD-10-CM | POA: Diagnosis not present

## 2020-07-26 ENCOUNTER — Encounter: Payer: Self-pay | Admitting: Neurology

## 2020-07-26 NOTE — Progress Notes (Signed)
Carelink Summary Report / Loop Recorder 

## 2020-08-01 NOTE — Progress Notes (Signed)
NEUROLOGY CONSULTATION NOTE  [name] MRN: [id number] DOB: 1971/02/11  Referring provider: N/A Primary care provider: [name], MD  Reason for consult:  migraines   Subjective:  [name]. Plato is a 49 year old right-handed female with paroxysmal atrial fibrillation with RVR s/p ablation, and history of melanoma, who presents for migraines.  History supplemented by cardiology notes.  She has had migraines since age 14.  They are typically severe right-sided pounding headache usually preceded by blurred vision/enlarged blind spot in right eye and neck pain.  Associated nausea, vomiting, photophobia and phonophobia.  They occurred on and off until college and then they have occurred infrequently, but when they would occur, she often would require an ED visit.  About 2 to 3 years ago, she began having new headaches which have progressively become more frequent.  They are moderate pulsating pain usually in right temple but sometimes behind both eyes or back of head.  No preceding aura.  Associated nausea, photophobia and phonophobia.  Often will wake up with them and will last until the next day.  Afterwards, her head feels sore.  Change in barometric pressure is a common trigger.  Bending over aggravates the headache.  She is unable to eat or exercise.  Cold compresses or other pressure helps relieve the pain.  Usually occurs a day prior to start of her menstrual cycle but on average occurs 3 to 4 days a month.    On 05/13/2017, she was seen in the ED for an intractable migraine.  MRI and MRA of brain without contrast personally reviewed were normal.    Current NSAIDS/analgesics:  Tylenol (ineffective) Current triptans:  none Current ergotamine:  none Current anti-emetic:  none Current muscle relaxants:  none Current Antihypertensive medications:  nadolol Current Antidepressant medications:  Venlafaxine XR 75mg  daily Current Anticonvulsant medications:  none Current anti-CGRP:   none Current Vitamins/Herbal/Supplements:  MVI Current Antihistamines/Decongestants:  none Other therapy:  Ice, compression Hormone/birth control:  none Other medications:  Seroquel 100mg  QHS PRN  Past NSAIDS/analgesics:  Advil (ulcer), Midrin Past abortive triptans:  none Past abortive ergotamine:  none Past muscle relaxants:  none Past anti-emetic:  Zofran Past antihypertensive medications:  Propranolol  Past antidepressant medications:  Amitriptyline, fluoxetine, Wellbutrin Past anticonvulsant medications:  none Past anti-CGRP:  none Past vitamins/Herbal/Supplements:  none Past antihistamines/decongestants:  cyproheptadine Other past therapies:  none  Caffeine:  2 cups coffee in AM, 1 diet Coke sometimes Diet:  Poor water intake.  Only skips meals if wakes up nauseous Exercise:  routine Depression:  controlled; Anxiety: controlled Other pain:  no Sleep:  Varies.  Uses Seroquel as needed.   Family history of headache:  Sister (migraines) No family history of aneurysm.        PAST MEDICAL HISTORY: Past Medical History:  Diagnosis Date  . Anxiety   . Atrial fibrillation ([place])    adm 01/2014  . Atrial fibrillation with RVR ([place])   . Depression   . Diabetes mellitus without complication ([place])    controlled with diet  . Insomnia   . OCD (obsessive compulsive disorder)   . SVT (supraventricular tachycardia) ([place])     PAST SURGICAL HISTORY: Past Surgical History:  Procedure Laterality Date  . APPENDECTOMY    . CESAREAN SECTION  2007  . DIAGNOSTIC LAPAROSCOPY    . ELECTROPHYSIOLOGIC STUDY N/A 03/26/2015   PVI by Dr Rayann Heman for afib  . LAPAROSCOPY  10/30/2011   Procedure: LAPAROSCOPY DIAGNOSTIC;  Surgeon: Lovenia Kim, MD;  Location: [facility];  Service: Gynecology;  Laterality: N/A;  . LOOP RECORDER INSERTION N/A 07/05/2017   Procedure: LOOP RECORDER INSERTION;  Surgeon: [name], MD;  Location: [facility];  Service: Cardiovascular;  Laterality: N/A;  .  TEE WITHOUT CARDIOVERSION N/A 03/25/2015   Procedure: TRANSESOPHAGEAL ECHOCARDIOGRAM (TEE);  Surgeon: Thayer Headings, MD;  Location: [facility] ENDOSCOPY;  Service: Cardiovascular;  Laterality: N/A;    MEDICATIONS: Current Outpatient Medications on File Prior to Visit  Medication Sig Dispense Refill  . albuterol (VENTOLIN HFA) 108 (90 Base) MCG/ACT inhaler Inhale 2 puffs into the lungs every 6 (six) hours as needed for wheezing or shortness of breath. 8 g 0  . benzonatate (TESSALON PERLES) 100 MG capsule Take 1 capsule (100 mg total) by mouth 3 (three) times daily as needed. 20 capsule 0  . BuPROPion HBr 174 MG TB24 Take 1 tablet by mouth daily.    Marland Kitchen Dexamethasone 1.5 MG (21) TBPK Take 1 Package by mouth daily. 21 each 0  . flecainide (TAMBOCOR) 100 MG tablet Take 1-3 tablets by mouth as needed at onset of fast heart rate(afib) 12 tablet 3  . methylphenidate 10 MG ER tablet Take 10 mg by mouth daily.    . Multiple Vitamins-Minerals (MULTIVITAMIN WITH MINERALS) tablet Take 1 tablet by mouth daily.    . nadolol (CORGARD) 20 MG tablet TAKE 1/2 TABLET BY MOUTH DAILY 45 tablet 3  . omeprazole (PRILOSEC) 40 MG capsule Take 1 capsule (40 mg total) by mouth daily before breakfast. 30 capsule 11  . QUEtiapine (SEROQUEL) 100 MG tablet Take 1 tablet by mouth as needed for sleep.    Marland Kitchen venlafaxine XR (EFFEXOR-XR) 75 MG 24 hr capsule Take 75 mg by mouth daily.   0   No current facility-administered medications on file prior to visit.    ALLERGIES: No Known Allergies  FAMILY HISTORY: Family History  Problem Relation Age of Onset  . COPD Mother   . Heart attack Father     SOCIAL HISTORY: Social History   Socioeconomic History  . Marital status: Married    Spouse name: Not on file  . Number of children: Not on file  . Years of education: Not on file  . Highest education level: Not on file  Occupational History  . Not on file  Tobacco Use  . Smoking status: Never Smoker  . Smokeless tobacco: Never  Used  Substance and Sexual Activity  . Alcohol use: Yes    Comment: occasionally  . Drug use: No  . Sexual activity: Not on file  Other Topics Concern  . Not on file  Social History Narrative  . Not on file   Social Determinants of Health   Financial Resource Strain: Not on file  Food Insecurity: Not on file  Transportation Needs: Not on file  Physical Activity: Not on file  Stress: Not on file  Social Connections: Not on file  Intimate Partner Violence: Not on file    Objective:  Blood pressure 115/80, pulse 92, height 5\' 5"  (1.651 m), weight 156 lb 6.4 oz (70.9 kg), SpO2 99 %. General: No acute distress.  Patient appears well-groomed.   Head:  Normocephalic/atraumatic Eyes:  fundi examined but not visualized Neck: supple, no paraspinal tenderness, full range of motion Back: No paraspinal tenderness Heart: regular rate and rhythm Lungs: Clear to auscultation bilaterally. Vascular: No carotid bruits. Neurological Exam: Mental status: alert and oriented to person, place, and time, recent and remote memory intact, fund of knowledge  intact, attention and concentration intact, speech fluent and not dysarthric, language intact. Cranial nerves: CN I: not tested CN II: pupils equal, round and reactive to light, visual fields intact CN III, IV, VI:  full range of motion, no nystagmus, no ptosis CN V: facial sensation intact. CN VII: upper and lower face symmetric CN VIII: hearing intact CN IX, X: gag intact, uvula midline CN XI: sternocleidomastoid and trapezius muscles intact CN XII: tongue midline Bulk & Tone: normal, no fasciculations. Motor:  muscle strength 5/5 throughout Sensation:  Pinprick, temperature and vibratory sensation intact. Deep Tendon Reflexes:  2+ throughout,  toes downgoing.   Finger to nose testing:  Without dysmetria.   Heel to shin:  Without dysmetria.   Gait:  Normal station and stride.  Romberg negative.  Assessment/Plan:   1.  Migraine without  aura, without status migrainosus, not intractable 2.  History of migraine with aura  1.  Due to worsening headaches and history of melanoma, will check MRI of brain with and without contrast 2.  For migraine prevention:  Start Qulipta 60mg  daily 3.  For nausea:  Zofran ODT 8mg  PRN 4.  Limit use of pain relievers to no more than 2 days out of week to prevent risk of rebound or medication-overuse headache. 5.  Keep headache diary 6.  Follow up in 4 months.    Thank you for allowing me to take part in the care of this patient.  Metta Clines, DO  CC: [name], MD

## 2020-08-02 ENCOUNTER — Ambulatory Visit: Payer: BC Managed Care – PPO | Admitting: Neurology

## 2020-08-02 ENCOUNTER — Other Ambulatory Visit: Payer: Self-pay

## 2020-08-02 ENCOUNTER — Encounter: Payer: Self-pay | Admitting: Neurology

## 2020-08-02 VITALS — BP 115/80 | HR 92 | Ht 65.0 in | Wt 156.4 lb

## 2020-08-02 DIAGNOSIS — Z85820 Personal history of malignant melanoma of skin: Secondary | ICD-10-CM

## 2020-08-02 DIAGNOSIS — G43009 Migraine without aura, not intractable, without status migrainosus: Secondary | ICD-10-CM

## 2020-08-02 MED ORDER — NURTEC 75 MG PO TBDP: 75.0000 mg | ORAL_TABLET | ORAL | 0 refills | Status: DC | PRN

## 2020-08-02 MED ORDER — QULIPTA 60 MG PO TABS: 60.0000 mg | ORAL_TABLET | Freq: Every day | ORAL | 5 refills | Status: DC

## 2020-08-02 MED ORDER — QULIPTA 60 MG PO TABS: 60.0000 mg | ORAL_TABLET | Freq: Every day | ORAL | 0 refills | Status: DC

## 2020-08-02 MED ORDER — ONDANSETRON 8 MG PO TBDP: 8.0000 mg | ORAL_TABLET | Freq: Three times a day (TID) | ORAL | 5 refills | Status: DC | PRN

## 2020-08-02 NOTE — Patient Instructions (Addendum)
  1. Due to worsening headaches, we will order MRI of brain without and with contrast 2. Start Qulipta 60mg  daily.  Contact us in 4 weeks with update and we can increase dose if needed. 3. Take Nurtec 75mg  tablet at earliest onset of headache.  Maximum 1 tablet in 24 hours.  If effective, contact me for prescription 4. For nausea, use ondansetron (Zofran ODT) 8mg  as needed. 5. Limit use of pain relievers to no more than 2 days out of the week.  These medications include acetaminophen, NSAIDs (ibuprofen/Advil/Motrin, naproxen/Aleve, triptans (Imitrex/sumatriptan), Excedrin, and narcotics.  This will help reduce risk of rebound headaches. 6. Be aware of common food triggers:  - Caffeine:  coffee, black tea, cola, Mt. Dew  - Chocolate  - Dairy:  aged cheeses (brie, blue, cheddar, gouda, [place], provolone, Fitz[place]ugh, Swiss, etc), chocolate milk, buttermilk, sour cream, limit eggs and yogurt  - Nuts, peanut butter  - Alcohol  - Cereals/grains:  FRESH breads (fresh bagels, sourdough, doughnuts), yeast productions  - Processed/canned/aged/cured meats (pre-packaged deli meats, hotdogs)  - MSG/glutamate:  soy sauce, flavor enhancer, pickled/preserved/marinated foods  - Sweeteners:  aspartame (Equal, Nutrasweet).  Sugar and Splenda are okay  - Vegetables:  legumes (lima beans, lentils, snow peas, fava beans, pinto peans, peas, garbanzo beans), sauerkraut, onions, olives, pickles  - Fruit:  avocados, bananas, citrus fruit (orange, lemon, grapefruit), mango  - Other:  Frozen meals, macaroni and cheese 7. Routine exercise 8. Stay adequately hydrated (aim for 64 oz water daily) 9. Keep headache diary 10. Maintain proper stress management 11. Maintain proper sleep hygiene 12. Do not skip meals 13. Consider supplements:  magnesium citrate 400mg  daily, riboflavin 400mg  daily, coenzyme Q10 100mg  three times daily. 14. Follow up in 4 months.

## 2020-08-07 ENCOUNTER — Encounter: Payer: Self-pay | Admitting: Neurology

## 2020-08-07 NOTE — Progress Notes (Signed)
Qulipta PA denied- not a covered drug- ins wants her to try Emgality or Aimovig first. Sent msg to [place] to see if we could send the denial letter to [employer]. Set denial letter on her desk also.

## 2020-08-08 ENCOUNTER — Other Ambulatory Visit: Payer: Self-pay

## 2020-08-08 DIAGNOSIS — Z85820 Personal history of malignant melanoma of skin: Secondary | ICD-10-CM

## 2020-08-08 DIAGNOSIS — G43009 Migraine without aura, not intractable, without status migrainosus: Secondary | ICD-10-CM

## 2020-08-08 NOTE — Progress Notes (Signed)
New order for Mri Brain with general anastasia.   Faxed Qulipta forms to company.

## 2020-08-09 NOTE — Progress Notes (Signed)
Received fax that patient was accepted into the [facility] through [facility]. So she will get her medication through them. Sent letter to chart.

## 2020-08-15 ENCOUNTER — Ambulatory Visit: Payer: BC Managed Care – PPO | Admitting: Neurology

## 2020-08-15 ENCOUNTER — Other Ambulatory Visit: Payer: Self-pay

## 2020-08-15 ENCOUNTER — Encounter: Payer: Self-pay | Admitting: Neurology

## 2020-08-15 VITALS — BP 110/76 | HR 100 | Ht 65.0 in | Wt 156.2 lb

## 2020-08-15 DIAGNOSIS — G43009 Migraine without aura, not intractable, without status migrainosus: Secondary | ICD-10-CM

## 2020-08-15 NOTE — Progress Notes (Signed)
NEUROLOGY FOLLOW UP OFFICE NOTE  FELIPE CABELL [id number]   Subjective:  [name] is a 49 year old female who presents for physical exam for upcoming MRI of brain under conscious sedation.  PAST MEDICAL HISTORY: Past Medical History:  Diagnosis Date  . Anxiety   . Atrial fibrillation ([place])    adm 01/2014  . Atrial fibrillation with RVR ([place])   . Depression   . Diabetes mellitus without complication ([place])    controlled with diet  . Insomnia   . OCD (obsessive compulsive disorder)   . SVT (supraventricular tachycardia) ([facility])     MEDICATIONS: Current Outpatient Medications on File Prior to Visit  Medication Sig Dispense Refill  . albuterol (VENTOLIN HFA) 108 (90 Base) MCG/ACT inhaler Inhale 2 puffs into the lungs every 6 (six) hours as needed for wheezing or shortness of breath. (Patient not taking: Reported on 08/02/2020) 8 g 0  . Atogepant (QULIPTA) 60 MG TABS Take 60 mg by mouth daily. 8 tablet 0  . Atogepant (QULIPTA) 60 MG TABS Take 60 mg by mouth daily. 30 tablet 5  . benzonatate (TESSALON PERLES) 100 MG capsule Take 1 capsule (100 mg total) by mouth 3 (three) times daily as needed. (Patient not taking: Reported on 08/02/2020) 20 capsule 0  . BuPROPion HBr 174 MG TB24 Take 1 tablet by mouth daily. (Patient not taking: Reported on 08/02/2020)    . Dexamethasone 1.5 MG (21) TBPK Take 1 Package by mouth daily. (Patient not taking: Reported on 08/02/2020) 21 each 0  . flecainide (TAMBOCOR) 100 MG tablet Take 1-3 tablets by mouth as needed at onset of fast heart rate(afib) (Patient not taking: Reported on 08/02/2020) 12 tablet 3  . methylphenidate 10 MG ER tablet Take 10 mg by mouth daily. (Patient not taking: Reported on 08/02/2020)    . Multiple Vitamins-Minerals (MULTIVITAMIN WITH MINERALS) tablet Take 1 tablet by mouth daily.    . nadolol (CORGARD) 20 MG tablet TAKE 1/2 TABLET BY MOUTH DAILY 45 tablet 3  . omeprazole (PRILOSEC) 40 MG capsule Take 1 capsule (40 mg total) by  mouth daily before breakfast. (Patient not taking: Reported on 08/02/2020) 30 capsule 11  . ondansetron (ZOFRAN ODT) 8 MG disintegrating tablet Take 1 tablet (8 mg total) by mouth every 8 (eight) hours as needed for nausea or vomiting. 20 tablet 5  . QUEtiapine (SEROQUEL) 100 MG tablet Take 1 tablet by mouth as needed for sleep.    . Rimegepant Sulfate (NURTEC) 75 MG TBDP Take 75 mg by mouth as needed. 4 tablet 0  . venlafaxine XR (EFFEXOR-XR) 75 MG 24 hr capsule Take 75 mg by mouth daily.   0   No current facility-administered medications on file prior to visit.    ALLERGIES: No Known Allergies  FAMILY HISTORY: Family History  Problem Relation Age of Onset  . COPD Mother   . Heart attack Father     SOCIAL HISTORY: Social History   Socioeconomic History  . Marital status: Married    Spouse name: Not on file  . Number of children: Not on file  . Years of education: Not on file  . Highest education level: Not on file  Occupational History  . Not on file  Tobacco Use  . Smoking status: Never Smoker  . Smokeless tobacco: Never Used  Vaping Use  . Vaping Use: Never used  Substance and Sexual Activity  . Alcohol use: Yes    Comment: occasionally  . Drug use: No  .  Sexual activity: Not on file  Other Topics Concern  . Not on file  Social History Narrative   Right handed   Social Determinants of Health   Financial Resource Strain: Not on file  Food Insecurity: Not on file  Transportation Needs: Not on file  Physical Activity: Not on file  Stress: Not on file  Social Connections: Not on file  Intimate Partner Violence: Not on file     Objective:  Blood pressure 110/76, pulse 100, height 5\' 5"  (1.651 m), weight 156 lb 3.2 oz (70.9 kg), SpO2 99 %. General: No acute distress.  Patient appears well-groomed.   Head:  Normocephalic/atraumatic Eyes:  Fundi examined but not visualized Neck: supple, no paraspinal tenderness, full range of motion Heart:  Regular rate and  rhythm Lungs:  Clear to auscultation bilaterally Neurological Exam: alert and oriented to person, place, and time. Attention span and concentration intact, recent and remote memory intact, fund of knowledge intact.  Speech fluent and not dysarthric, language intact.  CN II-XII intact. Bulk and tone normal, muscle strength 5/5 throughout.  Sensation to light touch, temperature and vibration intact.  Deep tendon reflexes 2+ throughout, toes downgoing.  Finger to nose testing intact.  Gait normal   Assessment/Plan:  Migraine without aura, worsening headaches  Qulipta MRI of brain w/wo under conscious sedation  Laura Clines, DO  CC: [name], MD

## 2020-08-21 LAB — CUP PACEART REMOTE DEVICE CHECK
Date Time Interrogation Session: 20211227225042
Implantable Pulse Generator Implant Date: 20181112

## 2020-08-22 ENCOUNTER — Ambulatory Visit (INDEPENDENT_AMBULATORY_CARE_PROVIDER_SITE_OTHER): Payer: BC Managed Care – PPO

## 2020-08-22 DIAGNOSIS — R002 Palpitations: Secondary | ICD-10-CM | POA: Diagnosis not present

## 2020-08-28 ENCOUNTER — Other Ambulatory Visit: Payer: Self-pay | Admitting: Physician Assistant

## 2020-08-28 DIAGNOSIS — Z1211 Encounter for screening for malignant neoplasm of colon: Secondary | ICD-10-CM | POA: Diagnosis not present

## 2020-08-28 DIAGNOSIS — R110 Nausea: Secondary | ICD-10-CM | POA: Diagnosis not present

## 2020-08-28 DIAGNOSIS — K5900 Constipation, unspecified: Secondary | ICD-10-CM | POA: Diagnosis not present

## 2020-08-28 DIAGNOSIS — R1030 Lower abdominal pain, unspecified: Secondary | ICD-10-CM

## 2020-09-03 ENCOUNTER — Other Ambulatory Visit: Payer: Self-pay | Admitting: Physician Assistant

## 2020-09-03 DIAGNOSIS — R1030 Lower abdominal pain, unspecified: Secondary | ICD-10-CM

## 2020-09-05 NOTE — Progress Notes (Signed)
Carelink Summary Report / Loop Recorder 

## 2020-09-06 ENCOUNTER — Other Ambulatory Visit (HOSPITAL_COMMUNITY)
Admission: RE | Admit: 2020-09-06 | Discharge: 2020-09-06 | Disposition: A | Discharge status: Home / Self Care | Payer: BC Managed Care – PPO | Source: Ambulatory Visit | Attending: Neurology | Admitting: Neurology

## 2020-09-06 ENCOUNTER — Encounter (HOSPITAL_COMMUNITY): Payer: Self-pay | Admitting: *Deleted

## 2020-09-06 DIAGNOSIS — Z01812 Encounter for preprocedural laboratory examination: Secondary | ICD-10-CM | POA: Diagnosis not present

## 2020-09-06 DIAGNOSIS — Z20822 Contact with and (suspected) exposure to covid-19: Secondary | ICD-10-CM | POA: Diagnosis not present

## 2020-09-06 LAB — SARS CORONAVIRUS 2 (TAT 6-24 HRS): SARS Coronavirus 2: NEGATIVE

## 2020-09-06 NOTE — Progress Notes (Signed)
Spoke with pt for pre-op call. Pt has hx of A-fib and SVT. Dr. Rayann Heman is her cardiologist. Pt does have a loop recorder. States she only takes Nadolol prn.  Pt states she was diagnosed "years ago" with type 2 Diabetes, but states for 10 years she has not been on medications and her A1C's are always in the normal range. She does not check her blood sugar and cannot remember when her last A1c was or what it was.  Covid test done today, results pending.  Pt states she's been in quarantine since the test was done and understands that she stays in quarantine until she comes to the hospital on Tuesday.

## 2020-09-07 ENCOUNTER — Ambulatory Visit (HOSPITAL_COMMUNITY): Payer: BC Managed Care – PPO

## 2020-09-09 NOTE — Anesthesia Preprocedure Evaluation (Signed)
Anesthesia Evaluation  Patient identified by MRN, date of birth, ID band Patient awake    Reviewed: Allergy & Precautions, NPO status , Patient's Chart, lab work & pertinent test results  History of Anesthesia Complications Negative for: history of anesthetic complications  Airway Mallampati: II  TM Distance: >3 FB Neck ROM: Full    Dental no notable dental hx.    Pulmonary neg pulmonary ROS,    Pulmonary exam normal        Cardiovascular Normal cardiovascular exam+ dysrhythmias Atrial Fibrillation and Supra Ventricular Tachycardia      Neuro/Psych  Headaches, Anxiety Depression    GI/Hepatic negative GI ROS, Neg liver ROS,   Endo/Other  diabetes, Well Controlled, Type 2  Renal/GU negative Renal ROS  negative genitourinary   Musculoskeletal negative musculoskeletal ROS (+)   Abdominal   Peds  Hematology negative hematology ROS (+)   Anesthesia Other Findings Day of surgery medications reviewed with patient.  Reproductive/Obstetrics negative OB ROS                           Anesthesia Physical Anesthesia Plan  ASA: II  Anesthesia Plan: General   Post-op Pain Management:    Induction: Intravenous  PONV Risk Score and Plan: 3 and Treatment may vary due to age or medical condition, Ondansetron, Dexamethasone and Midazolam  Airway Management Planned: Oral ETT  Additional Equipment: None  Intra-op Plan:   Post-operative Plan: Extubation in OR  Informed Consent: I have reviewed the patients History and Physical, chart, labs and discussed the procedure including the risks, benefits and alternatives for the proposed anesthesia with the patient or authorized representative who has indicated his/her understanding and acceptance.     Dental advisory given  Plan Discussed with: CRNA  Anesthesia Plan Comments: (PAT note written 09/09/2020 by Myra Gianotti, PA-C. )       Anesthesia Quick Evaluation

## 2020-09-09 NOTE — Progress Notes (Signed)
Anesthesia Chart Review: [name]   Case: [id number] Date/Time: 09/10/20 0945   Procedure: MRI BRAIN WITH AND WITHOUT CONTRAST (N/A )   Anesthesia type: General   Pre-op diagnosis: MIGRAINE WITHOUT AURA   Location: [facility] RADIOLOGY ROOM / [place] [facility]   Surgeons: Radiologist, Medication, MD      DISCUSSION: Patient is a 50 year old female scheduled for the above procedure. H&P done by Metta Clines, DO on 08/15/20.   History includes never smoker, OCD, afib/SVT(s/p afib ablation 03/26/15; s/p loop recorder 07/05/17), sinus tachycardia, DM2 (diet controlled), migraines, melanoma. Reported "wakes up early from anesthesia".  12 month follow-up recommended at 09/2018 visit with Dr. Rayann Heman. No afib noted on 08/19/20 ILR interrogation (Medtronic Reveal Devola model [id number]).  09/06/20 COVID-19 test negative. Anesthesia team to evaluate on the day of procedure.   VS: LMP 09/03/2020   BP Readings from Last 3 Encounters:  08/15/20 110/76  08/02/20 115/80  10/03/18 116/76   Pulse Readings from Last 3 Encounters:  08/15/20 100  08/02/20 92  10/03/18 (!) 124    PROVIDERS: [name], MD is GYN [name], MD is EP cardiologist. Last tele visit 09/27/19. PAF well controlled post ablation. ILR revealed 0% afib burden. Has flecainide as needed, but had not had to use. Uses nadolol as needed for tachycardia and was using about once a week at that time. 12 month follow-up recommended.    LABS: For day of procedure.   EKG: 10/03/18: ST at 125 bpm   CV: TEE 03/25/15: Study Conclusions  - Left ventricle: The cavity size was normal. Wall thickness was  normal. Systolic function was normal. The estimated ejection  fraction was in the range of 60% to 65%.  - Aortic valve: No evidence of vegetation.  - Mitral valve: No evidence of vegetation.  - Left atrium: No evidence of thrombus in the atrial cavity or  appendage.  - Atrial septum: No defect or patent foramen ovale was  identified.    ETT 01/03/15:  There was no ST segment deviation noted during stress.   Good exercise capacity. No chest pain. Normal BP response to exercise. No ST changes to suggest ischemia.  No exercise induced arrhythmias.   Past Medical History:  Diagnosis Date  . Anxiety   . Atrial fibrillation ([place])    adm 01/2014  . Atrial fibrillation with RVR ([place])   . Cancer ([place])    melanoma x 2 right bicep and left thigh  . Chronic constipation   . Complication of anesthesia    wakes up early from anesthesia  . Depression   . Diabetes mellitus without complication ([place])    pt states she has not been on medications for 10 years. States her A1C is always in the normal range - per pt on 09/06/20  . Headache    hx of migraines and went away and now have come back  . Insomnia   . OCD (obsessive compulsive disorder)   . SVT (supraventricular tachycardia) ([facility])     Past Surgical History:  Procedure Laterality Date  . APPENDECTOMY    . CESAREAN SECTION  2007  . DIAGNOSTIC LAPAROSCOPY    . ELECTROPHYSIOLOGIC STUDY N/A 03/26/2015   PVI by Dr Rayann Heman for afib  . LAPAROSCOPY  10/30/2011   Procedure: LAPAROSCOPY DIAGNOSTIC;  Surgeon: Lovenia Kim, MD;  Location: [facility];  Service: Gynecology;  Laterality: N/A;  . LOOP RECORDER INSERTION N/A 07/05/2017   Procedure: LOOP RECORDER INSERTION;  Surgeon:  [name], MD;  Location: [facility];  Service: Cardiovascular;  Laterality: N/A;  . TEE WITHOUT CARDIOVERSION N/A 03/25/2015   Procedure: TRANSESOPHAGEAL ECHOCARDIOGRAM (TEE);  Surgeon: Thayer Headings, MD;  Location: [facility];  Service: Cardiovascular;  Laterality: N/A;  . WISDOM TOOTH EXTRACTION      MEDICATIONS: No current facility-administered medications for this encounter.   . Atogepant (QULIPTA) 60 MG TABS  . Multiple Vitamins-Minerals (MULTIVITAMIN WITH MINERALS) tablet  . nadolol (CORGARD) 20 MG tablet  . QUEtiapine (SEROQUEL) 25 MG tablet  . Rimegepant Sulfate  (NURTEC) 75 MG TBDP  . venlafaxine XR (EFFEXOR-XR) 75 MG 24 hr capsule    Myra Gianotti, PA-C Surgical Short Stay/Anesthesiology [facility] Phone [phone] [facility] Phone [phone] 09/09/2020 10:10 AM

## 2020-09-10 ENCOUNTER — Other Ambulatory Visit: Payer: Self-pay

## 2020-09-10 ENCOUNTER — Ambulatory Visit (HOSPITAL_COMMUNITY)
Admission: RE | Admit: 2020-09-10 | Discharge: 2020-09-10 | Disposition: A | Discharge status: Home / Self Care | Payer: BC Managed Care – PPO | Attending: Neurology | Admitting: Neurology

## 2020-09-10 ENCOUNTER — Ambulatory Visit (HOSPITAL_COMMUNITY)
Admission: RE | Admit: 2020-09-10 | Discharge: 2020-09-10 | Disposition: A | Discharge status: Home / Self Care | Payer: BC Managed Care – PPO | Source: Ambulatory Visit | Attending: Neurology | Admitting: Neurology

## 2020-09-10 ENCOUNTER — Encounter (HOSPITAL_COMMUNITY): Payer: Self-pay | Admitting: Certified Registered Nurse Anesthetist

## 2020-09-10 ENCOUNTER — Encounter (HOSPITAL_COMMUNITY): Admission: RE | Disposition: A | Discharge status: Home / Self Care | Payer: Self-pay | Source: Home / Self Care

## 2020-09-10 ENCOUNTER — Ambulatory Visit (HOSPITAL_COMMUNITY): Payer: BC Managed Care – PPO | Admitting: Vascular Surgery

## 2020-09-10 DIAGNOSIS — G43009 Migraine without aura, not intractable, without status migrainosus: Secondary | ICD-10-CM

## 2020-09-10 DIAGNOSIS — E119 Type 2 diabetes mellitus without complications: Secondary | ICD-10-CM | POA: Diagnosis not present

## 2020-09-10 DIAGNOSIS — I480 Paroxysmal atrial fibrillation: Secondary | ICD-10-CM | POA: Diagnosis not present

## 2020-09-10 DIAGNOSIS — F429 Obsessive-compulsive disorder, unspecified: Secondary | ICD-10-CM | POA: Diagnosis not present

## 2020-09-10 DIAGNOSIS — Z85820 Personal history of malignant melanoma of skin: Secondary | ICD-10-CM

## 2020-09-10 HISTORY — DX: Malignant (primary) neoplasm, unspecified: C80.1

## 2020-09-10 HISTORY — DX: Headache, unspecified: R51.9

## 2020-09-10 HISTORY — DX: Other constipation: K59.09

## 2020-09-10 HISTORY — PX: RADIOLOGY WITH ANESTHESIA: SHX6223

## 2020-09-10 LAB — CBC
HCT: 44.7 % (ref 36.0–46.0)
Hemoglobin: 14.1 g/dL (ref 12.0–15.0)
MCH: 29 pg (ref 26.0–34.0)
MCHC: 31.5 g/dL (ref 30.0–36.0)
MCV: 91.8 fL (ref 80.0–100.0)
Platelets: 235 10*3/uL (ref 150–400)
RBC: 4.87 MIL/uL (ref 3.87–5.11)
RDW: 12.4 % (ref 11.5–15.5)
WBC: 5.7 10*3/uL (ref 4.0–10.5)
nRBC: 0 % (ref 0.0–0.2)

## 2020-09-10 LAB — BASIC METABOLIC PANEL
Anion gap: 11 (ref 5–15)
BUN: 19 mg/dL (ref 6–20)
CO2: 24 mmol/L (ref 22–32)
Calcium: 9.2 mg/dL (ref 8.9–10.3)
Chloride: 103 mmol/L (ref 98–111)
Creatinine, Ser: 0.87 mg/dL (ref 0.44–1.00)
GFR, Estimated: >60 mL/min (ref >60)
Glucose, Bld: 85 mg/dL (ref 70–99)
Potassium: 3.8 mmol/L (ref 3.5–5.1)
Sodium: 138 mmol/L (ref 135–145)

## 2020-09-10 LAB — GLUCOSE, CAPILLARY
Glucose-Capillary: 76 mg/dL (ref 70–99)
Glucose-Capillary: 48 mg/dL — ABNORMAL LOW (ref 70–99)
Glucose-Capillary: 61 mg/dL — ABNORMAL LOW (ref 70–99)
Glucose-Capillary: 75 mg/dL (ref 70–99)

## 2020-09-10 LAB — POCT PREGNANCY, URINE: Preg Test, Ur: NEGATIVE

## 2020-09-10 SURGERY — MRI WITH ANESTHESIA
Anesthesia: General

## 2020-09-10 MED ORDER — PHENYLEPHRINE 40 MCG/ML (10ML) SYRINGE FOR IV PUSH (FOR BLOOD PRESSURE SUPPORT)
PREFILLED_SYRINGE | INTRAVENOUS | Status: DC | PRN
  Administered 2020-09-10: 80 ug via INTRAVENOUS
  Administered 2020-09-10: 40 ug via INTRAVENOUS
  Administered 2020-09-10: 120 ug via INTRAVENOUS

## 2020-09-10 MED ORDER — ROCURONIUM BROMIDE 10 MG/ML (PF) SYRINGE
PREFILLED_SYRINGE | INTRAVENOUS | Status: DC | PRN
  Administered 2020-09-10: 40 mg via INTRAVENOUS

## 2020-09-10 MED ORDER — DEXAMETHASONE SODIUM PHOSPHATE 10 MG/ML IJ SOLN
INTRAMUSCULAR | Status: DC | PRN
  Administered 2020-09-10: 10 mg via INTRAVENOUS

## 2020-09-10 MED ORDER — FENTANYL CITRATE (PF) 100 MCG/2ML IJ SOLN: 25.0000 ug | INTRAMUSCULAR | Status: DC | PRN

## 2020-09-10 MED ORDER — GADOBUTROL 1 MMOL/ML IV SOLN
6.0000 mL | Freq: Once | INTRAVENOUS | Status: AC | PRN
  Administered 2020-09-10: 6 mL via INTRAVENOUS

## 2020-09-10 MED ORDER — MIDAZOLAM HCL 5 MG/5ML IJ SOLN
INTRAMUSCULAR | Status: DC | PRN
  Administered 2020-09-10 (×2): 2 mg via INTRAVENOUS

## 2020-09-10 MED ORDER — ONDANSETRON HCL 4 MG/2ML IJ SOLN
INTRAMUSCULAR | Status: DC | PRN
  Administered 2020-09-10: 4 mg via INTRAVENOUS

## 2020-09-10 MED ORDER — PROMETHAZINE HCL 25 MG/ML IJ SOLN: 6.2500 mg | INTRAMUSCULAR | Status: DC | PRN

## 2020-09-10 MED ORDER — LIDOCAINE 2% (20 MG/ML) 5 ML SYRINGE
INTRAMUSCULAR | Status: DC | PRN
  Administered 2020-09-10: 80 mg via INTRAVENOUS

## 2020-09-10 MED ORDER — FENTANYL CITRATE (PF) 100 MCG/2ML IJ SOLN
INTRAMUSCULAR | Status: DC | PRN
  Administered 2020-09-10: 50 ug via INTRAVENOUS
  Administered 2020-09-10: 100 ug via INTRAVENOUS

## 2020-09-10 MED ORDER — SUGAMMADEX SODIUM 200 MG/2ML IV SOLN
INTRAVENOUS | Status: DC | PRN
  Administered 2020-09-10: 200 mg via INTRAVENOUS

## 2020-09-10 MED ORDER — PROPOFOL 10 MG/ML IV BOLUS
INTRAVENOUS | Status: DC | PRN
  Administered 2020-09-10: 130 mg via INTRAVENOUS
  Administered 2020-09-10: 30 mg via INTRAVENOUS

## 2020-09-10 MED ORDER — LACTATED RINGERS IV SOLN: INTRAVENOUS | Status: DC | PRN

## 2020-09-10 NOTE — Transfer of Care (Signed)
Immediate Anesthesia Transfer of Care Note  Patient: [name]  Procedure(s) Performed: MRI BRAIN WITH AND WITHOUT CONTRAST (N/A )  Patient Location: PACU  Anesthesia Type:General  Level of Consciousness: awake, alert  and oriented  Airway & Oxygen Therapy: Patient Spontanous Breathing  Post-op Assessment: Report given to RN and Post -op Vital signs reviewed and stable  Post vital signs: Reviewed and stable  Last Vitals:  Vitals Value Taken Time  BP 113/75 09/10/20 1118  Temp    Pulse 101 09/10/20 1120  Resp 16 09/10/20 1120  SpO2 99 % 09/10/20 1120  Vitals shown include unvalidated device data.  Last Pain:  Vitals:   09/10/20 0800  TempSrc: Oral  PainSc:          Complications: No complications documented.

## 2020-09-10 NOTE — Anesthesia Procedure Notes (Signed)
Procedure Name: Intubation Date/Time: 09/10/2020 10:26 AM Performed by: [name], MD Pre-anesthesia Checklist: Patient identified, Emergency Drugs available, Suction available and Patient being monitored Patient Re-evaluated:Patient Re-evaluated prior to induction Oxygen Delivery Method: Circle System Utilized Preoxygenation: Pre-oxygenation with 100% oxygen Induction Type: IV induction Ventilation: Mask ventilation without difficulty Laryngoscope Size: Miller and 2 Grade View: Grade II Tube type: Oral Tube size: 7.0 mm Number of attempts: 2 Airway Equipment and Method: Stylet and Oral airway Placement Confirmation: ETT inserted through vocal cords under direct vision,  positive ETCO2 and breath sounds checked- equal and bilateral Secured at: 21 cm Tube secured with: Tape Dental Injury: Teeth and Oropharynx as per pre-operative assessment  Comments: First attempt by CRNA unable to visualize glottis with MAC3 blade. Second attempt by myself with grade 2B view with Miller 2 blade. Mask ventilated between attempts. Atraumatic intubation. Laura Huge, MD

## 2020-09-11 ENCOUNTER — Telehealth: Payer: Self-pay

## 2020-09-11 ENCOUNTER — Encounter (HOSPITAL_COMMUNITY): Payer: Self-pay | Admitting: Radiology

## 2020-09-11 NOTE — Telephone Encounter (Signed)
Called and spoke to patient and informed her of results. patient had no questions or concerns in regards to her MRI.  Patient wanted to check and see the status of her Qulipta. She stated her insurance was not covering her Rx and she needed to be placed in a program.Pateitn requested I call her back later due to her having to take her kids to school. I informed patient that we are short staff today and that we would look into this matter and reach out as soon as we are able to. Patient verbalized understanding.

## 2020-09-11 NOTE — Telephone Encounter (Signed)
-----   Message from Pieter Partridge, DO sent at 09/10/2020 12:55 PM EST ----- MRI of brain shows nothing concerning.  There are minimal tiny spots on the brain which are nonspecific and often seen in people with migraines.  I don't think it is indicative of anything concerning.

## 2020-09-11 NOTE — Telephone Encounter (Signed)
-----   Message from Adam R Jaffe, DO sent at 09/10/2020 12:55 PM EST ----- MRI of brain shows nothing concerning.  There are minimal tiny spots on the brain which are nonspecific and often seen in people with migraines.  I don't think it is indicative of anything concerning.   

## 2020-09-11 NOTE — Telephone Encounter (Signed)
Telephone call to pt, LMOVM with [facility] number [phone]. As well as to reach out to the rep. To see what is going on with the pt's medication.  Per rep. Derk for [employer], He will look into it and give [place] a call back.

## 2020-09-11 NOTE — Anesthesia Postprocedure Evaluation (Signed)
Anesthesia Post Note  Patient: [name]  Procedure(s) Performed: MRI BRAIN WITH AND WITHOUT CONTRAST (N/A )     Patient location during evaluation: PACU Anesthesia Type: General Level of consciousness: awake and alert and oriented Pain management: pain level controlled Vital Signs Assessment: post-procedure vital signs reviewed and stable Respiratory status: spontaneous breathing, nonlabored ventilation and respiratory function stable Cardiovascular status: blood pressure returned to baseline Postop Assessment: no apparent nausea or vomiting Anesthetic complications: no   No complications documented.  Last Vitals:  Vitals:   09/10/20 1130 09/10/20 1145  BP: 115/83 121/88  Pulse: (!) 103 87  Resp: 14 14  Temp:  (!) 36.3 C  SpO2: 100% 95%    Last Pain:  Vitals:   09/10/20 1145  TempSrc:   PainSc: 0-No pain                 [name]

## 2020-09-12 ENCOUNTER — Other Ambulatory Visit: Payer: BC Managed Care – PPO

## 2020-09-12 NOTE — Telephone Encounter (Signed)
Telephone call to pt, Per Rep with Qulipta Pt was mailed some information that they needed.    Per Pt she did fill that out and mailed it back. She will give them a call today and see what she need to do.

## 2020-09-21 LAB — CUP PACEART REMOTE DEVICE CHECK
Date Time Interrogation Session: 20220127225542
Implantable Pulse Generator Implant Date: 20181112

## 2020-09-23 ENCOUNTER — Ambulatory Visit (INDEPENDENT_AMBULATORY_CARE_PROVIDER_SITE_OTHER): Payer: BC Managed Care – PPO

## 2020-09-23 DIAGNOSIS — R002 Palpitations: Secondary | ICD-10-CM

## 2020-09-23 DIAGNOSIS — Z01812 Encounter for preprocedural laboratory examination: Secondary | ICD-10-CM | POA: Diagnosis not present

## 2020-09-26 DIAGNOSIS — K3189 Other diseases of stomach and duodenum: Secondary | ICD-10-CM | POA: Diagnosis not present

## 2020-09-26 DIAGNOSIS — D127 Benign neoplasm of rectosigmoid junction: Secondary | ICD-10-CM | POA: Diagnosis not present

## 2020-09-26 DIAGNOSIS — K5730 Diverticulosis of large intestine without perforation or abscess without bleeding: Secondary | ICD-10-CM | POA: Diagnosis not present

## 2020-09-26 DIAGNOSIS — D123 Benign neoplasm of transverse colon: Secondary | ICD-10-CM | POA: Diagnosis not present

## 2020-09-26 DIAGNOSIS — K648 Other hemorrhoids: Secondary | ICD-10-CM | POA: Diagnosis not present

## 2020-09-26 DIAGNOSIS — K317 Polyp of stomach and duodenum: Secondary | ICD-10-CM | POA: Diagnosis not present

## 2020-09-26 DIAGNOSIS — K2980 Duodenitis without bleeding: Secondary | ICD-10-CM | POA: Diagnosis not present

## 2020-09-26 DIAGNOSIS — K5710 Diverticulosis of small intestine without perforation or abscess without bleeding: Secondary | ICD-10-CM | POA: Diagnosis not present

## 2020-09-26 DIAGNOSIS — D125 Benign neoplasm of sigmoid colon: Secondary | ICD-10-CM | POA: Diagnosis not present

## 2020-09-26 DIAGNOSIS — R110 Nausea: Secondary | ICD-10-CM | POA: Diagnosis not present

## 2020-09-26 DIAGNOSIS — Z1211 Encounter for screening for malignant neoplasm of colon: Secondary | ICD-10-CM | POA: Diagnosis not present

## 2020-09-26 DIAGNOSIS — K319 Disease of stomach and duodenum, unspecified: Secondary | ICD-10-CM | POA: Diagnosis not present

## 2020-10-01 NOTE — Progress Notes (Signed)
Carelink Summary Report / Loop Recorder 

## 2020-10-24 ENCOUNTER — Ambulatory Visit (INDEPENDENT_AMBULATORY_CARE_PROVIDER_SITE_OTHER): Payer: BC Managed Care – PPO

## 2020-10-24 DIAGNOSIS — I480 Paroxysmal atrial fibrillation: Secondary | ICD-10-CM | POA: Diagnosis not present

## 2020-10-25 DIAGNOSIS — R7301 Impaired fasting glucose: Secondary | ICD-10-CM | POA: Diagnosis not present

## 2020-10-25 DIAGNOSIS — R400 Somnolence: Secondary | ICD-10-CM | POA: Diagnosis not present

## 2020-10-25 DIAGNOSIS — R0602 Shortness of breath: Secondary | ICD-10-CM | POA: Diagnosis not present

## 2020-10-25 DIAGNOSIS — R4189 Other symptoms and signs involving cognitive functions and awareness: Secondary | ICD-10-CM | POA: Diagnosis not present

## 2020-10-28 LAB — CUP PACEART REMOTE DEVICE CHECK
Date Time Interrogation Session: 20220227225623
Implantable Pulse Generator Implant Date: 20181112

## 2020-11-04 NOTE — Progress Notes (Signed)
Carelink Summary Report / Loop Recorder 

## 2020-11-21 LAB — CUP PACEART REMOTE DEVICE CHECK
Date Time Interrogation Session: 20220330235758
Implantable Pulse Generator Implant Date: 20181112

## 2020-11-25 ENCOUNTER — Ambulatory Visit (INDEPENDENT_AMBULATORY_CARE_PROVIDER_SITE_OTHER): Payer: BC Managed Care – PPO

## 2020-11-25 DIAGNOSIS — I480 Paroxysmal atrial fibrillation: Secondary | ICD-10-CM | POA: Diagnosis not present

## 2020-12-02 DIAGNOSIS — R112 Nausea with vomiting, unspecified: Secondary | ICD-10-CM | POA: Diagnosis not present

## 2020-12-02 DIAGNOSIS — W57XXXA Bitten or stung by nonvenomous insect and other nonvenomous arthropods, initial encounter: Secondary | ICD-10-CM | POA: Diagnosis not present

## 2020-12-02 DIAGNOSIS — S00461A Insect bite (nonvenomous) of right ear, initial encounter: Secondary | ICD-10-CM | POA: Diagnosis not present

## 2020-12-02 DIAGNOSIS — R400 Somnolence: Secondary | ICD-10-CM | POA: Diagnosis not present

## 2020-12-04 DIAGNOSIS — D0372 Melanoma in situ of left lower limb, including hip: Secondary | ICD-10-CM | POA: Diagnosis not present

## 2020-12-04 DIAGNOSIS — D2271 Melanocytic nevi of right lower limb, including hip: Secondary | ICD-10-CM | POA: Diagnosis not present

## 2020-12-04 DIAGNOSIS — D2272 Melanocytic nevi of left lower limb, including hip: Secondary | ICD-10-CM | POA: Diagnosis not present

## 2020-12-04 DIAGNOSIS — Z85820 Personal history of malignant melanoma of skin: Secondary | ICD-10-CM | POA: Diagnosis not present

## 2020-12-04 DIAGNOSIS — L918 Other hypertrophic disorders of the skin: Secondary | ICD-10-CM | POA: Diagnosis not present

## 2020-12-05 NOTE — Progress Notes (Signed)
Carelink Summary Report / Loop Recorder 

## 2020-12-06 ENCOUNTER — Other Ambulatory Visit: Payer: Self-pay | Admitting: Obstetrics and Gynecology

## 2020-12-06 DIAGNOSIS — N6001 Solitary cyst of right breast: Secondary | ICD-10-CM

## 2020-12-07 IMAGING — US ULTRASOUND RIGHT BREAST LIMITED
1 series · 7 of 7 positions shown · non-contrast
Comparison: Previous exam(s).

CLINICAL DATA: Short-term follow-up for probably benign right
breast mass.

EXAM:
ULTRASOUND OF THE RIGHT BREAST

[Series 1: ultrasound right breast limited · 0.06mm/px · 7 of 7 slices shown]
[im 1/7]
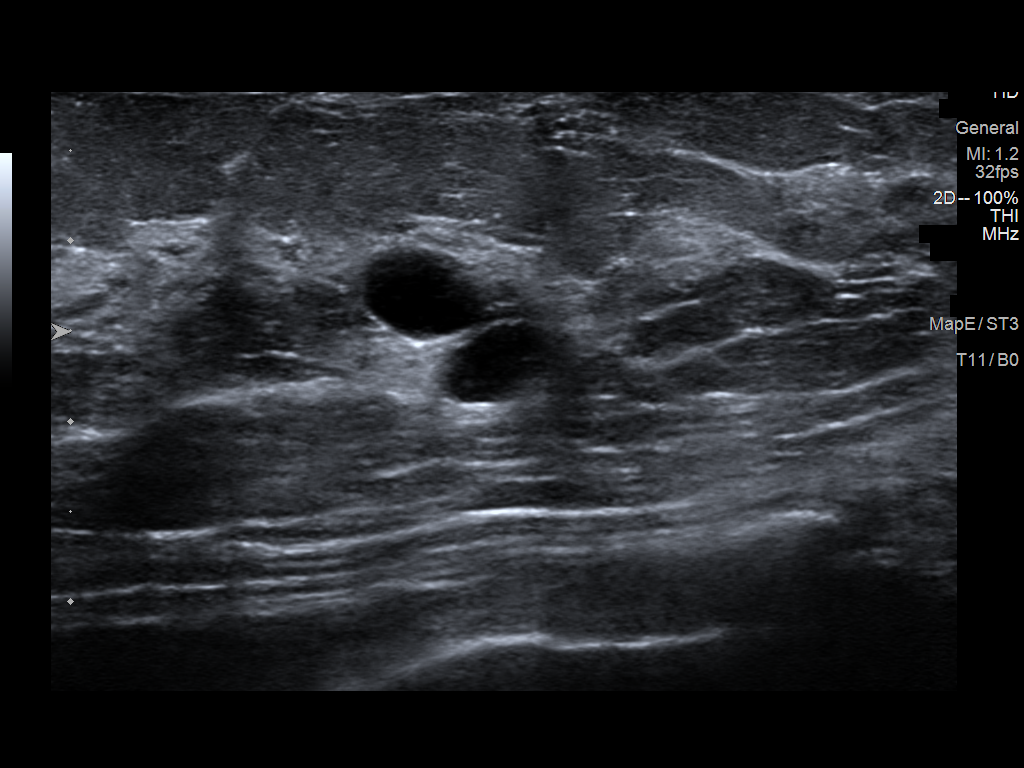
[im 2/7]
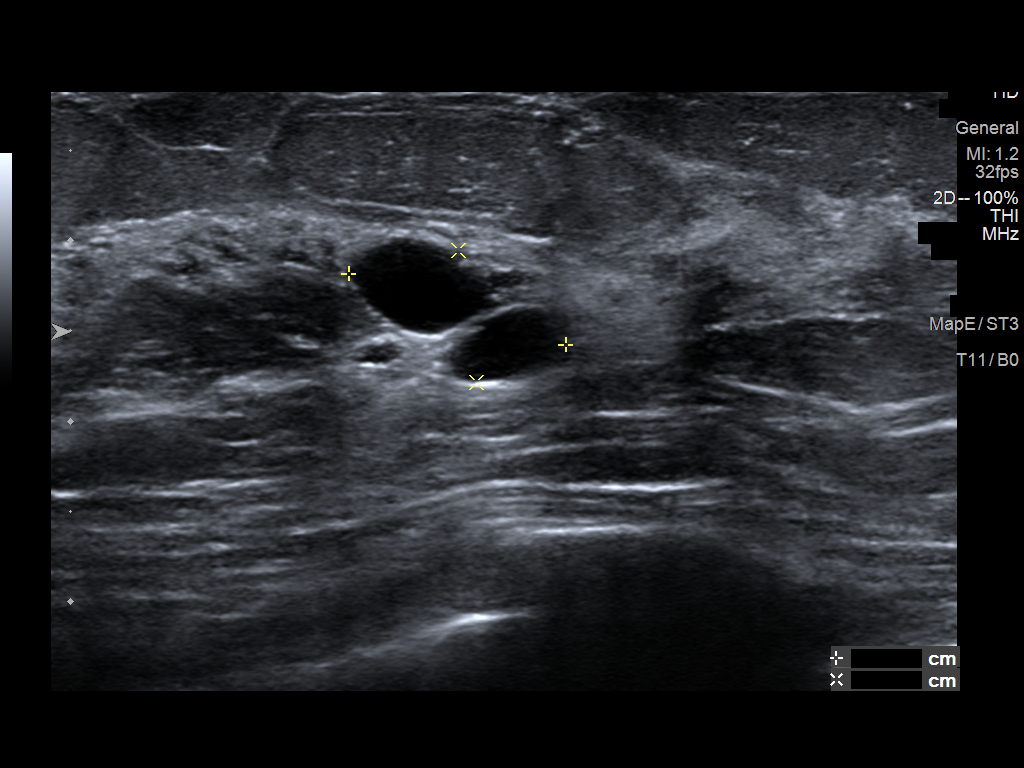
[im 3/7]
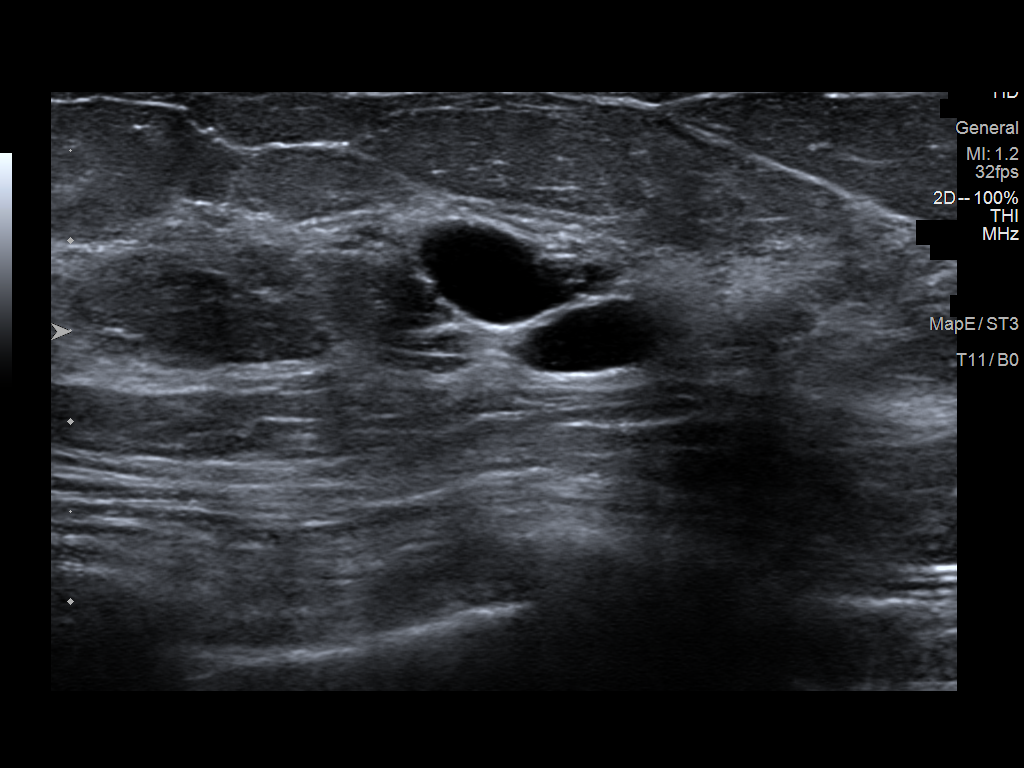
[im 4/7]
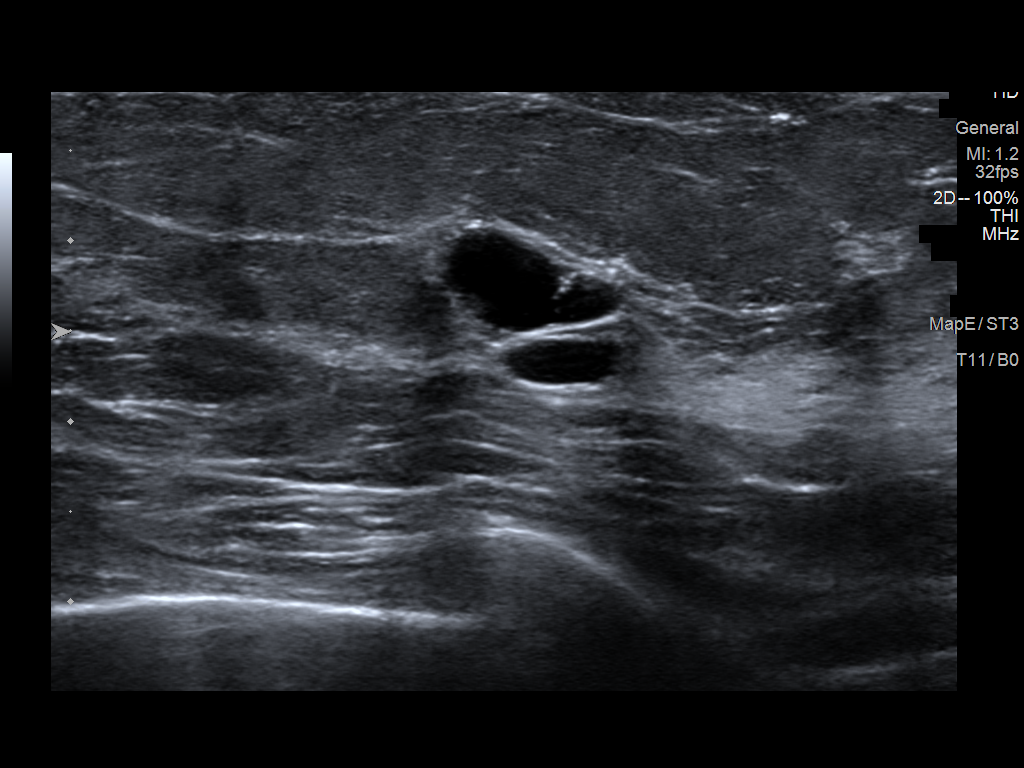
[im 5/7]
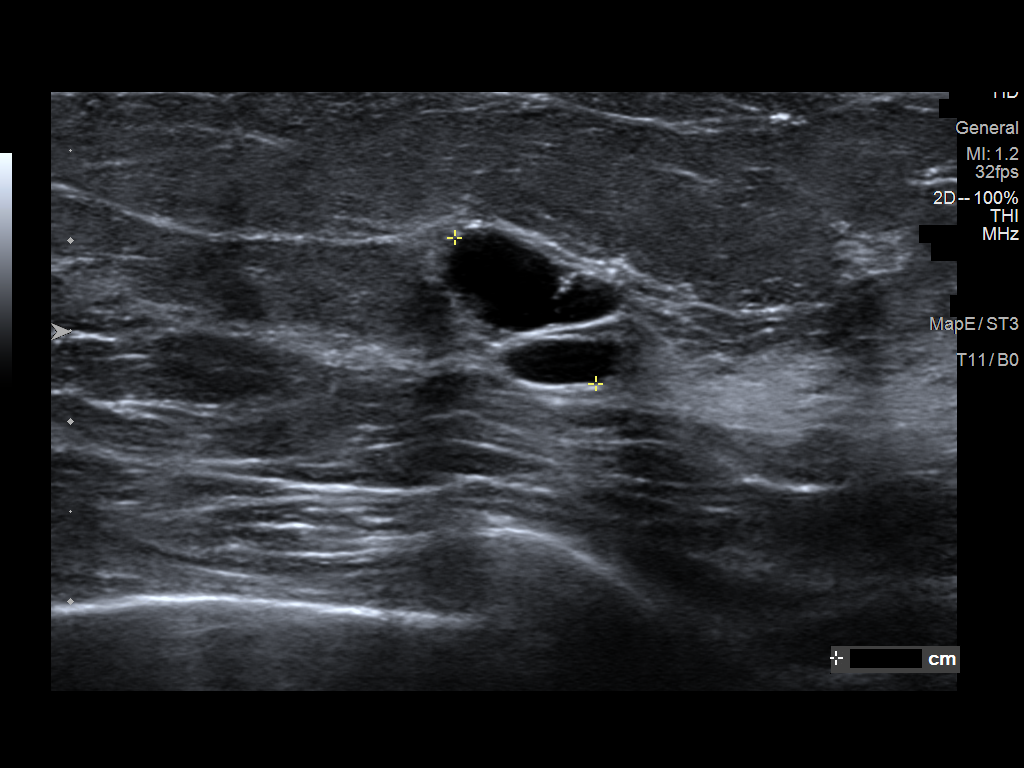
[im 6/7]
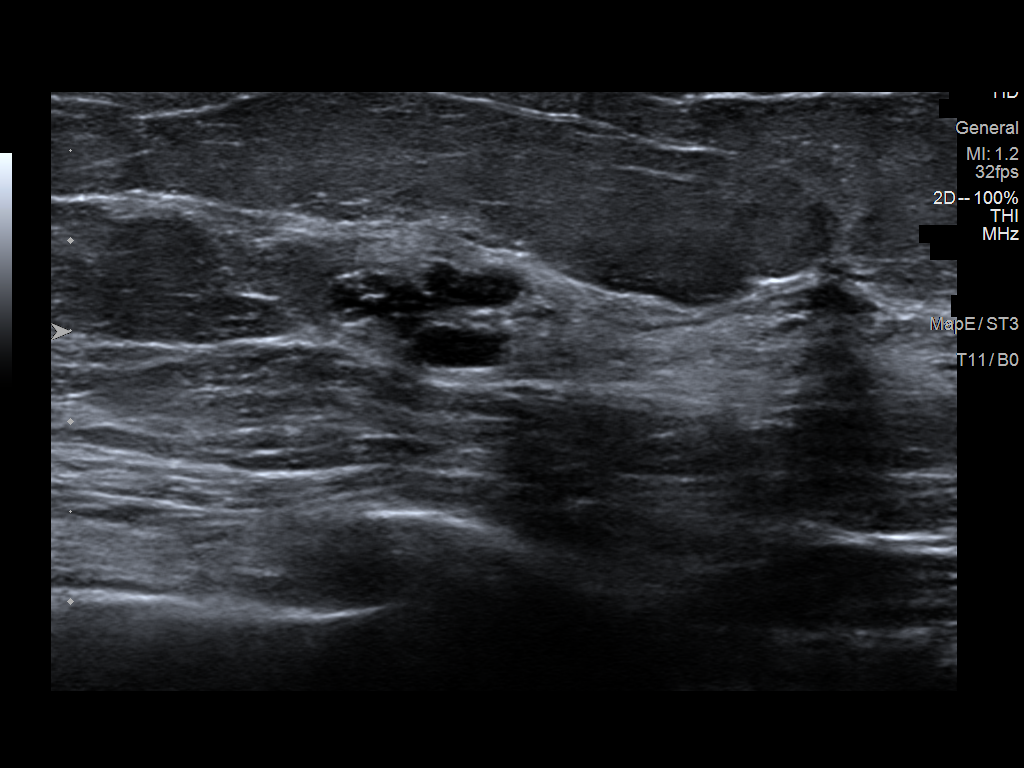
[im 7/7]
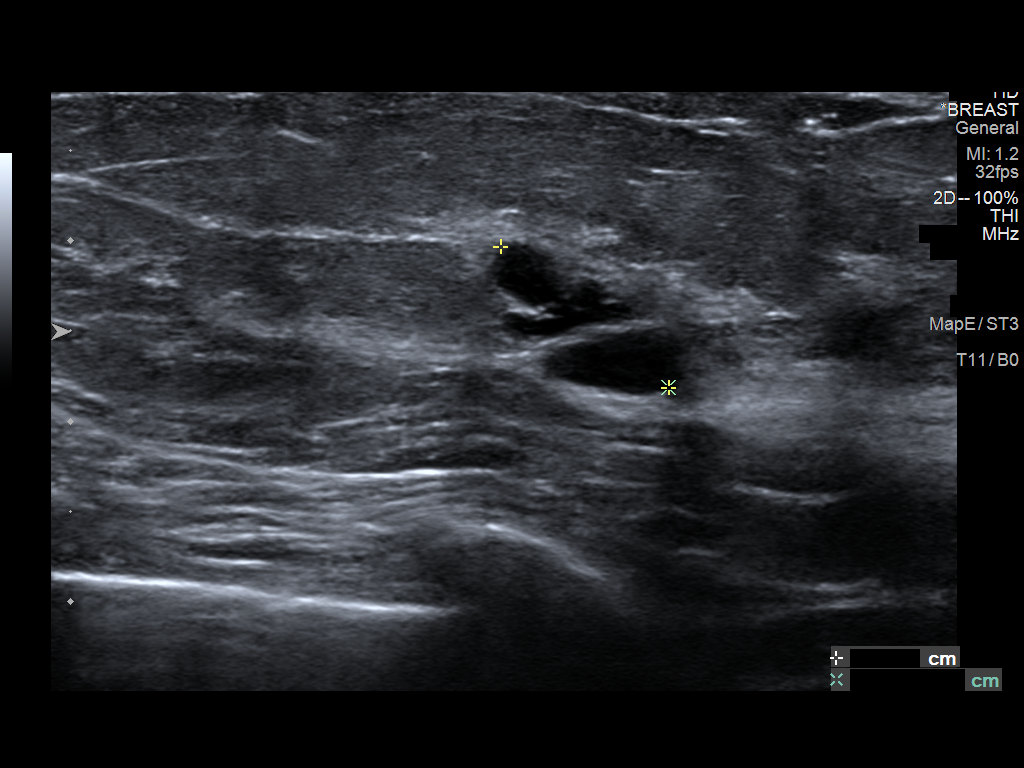

[7 of 7 positions shown; findings below may reference images not displayed]

FINDINGS: Targeted ultrasound of the right breast was performed demonstrating
a cluster of cysts at the 2 o'clock position 3 cm from nipple
measuring 1.3 x 0.7 x 1.1 cm, stable to possibly slightly smaller in
size when compared to the prior exam on 10/11/2018 previously
measuring 1.3 x 0.9 x 1.3 cm.
IMPRESSION: Stable benign-appearing cluster of cysts in the right breast at the
2 o'clock position.

RECOMMENDATION:
Bilateral diagnostic mammography September 2019 with right breast
ultrasound which will demonstrate 1 year stability of the probably
benign right breast mass.

I have discussed the findings and recommendations with the patient.
Results were also provided in writing at the conclusion of the
visit. If applicable, a reminder letter will be sent to the patient
regarding the next appointment.

BI-RADS CATEGORY  3: Probably benign.

## 2020-12-16 NOTE — Progress Notes (Signed)
Virtual Visit via Video Note The purpose of this virtual visit is to provide medical care while limiting exposure to the novel coronavirus.    Consent was obtained for video visit:  yes Answered questions that patient had about telehealth interaction:  Yes. I discussed the limitations, risks, security and privacy concerns of performing an evaluation and management service by telemedicine. I also discussed with the patient that there may be a patient responsible charge related to this service. The patient expressed understanding and agreed to proceed.  Pt location: Home Physician Location: office Name of referring provider:  [name], MD I connected with Volney American at patients initiation/request on 12/17/2020 at  8:30 AM EDT by video enabled telemedicine application and verified that I am speaking with the correct person using two identifiers. Pt MRN:  [id number] Pt DOB:  April 20, 1971 Video Participants:  Volney American  Assessment and Plan:   Migraine without aura, without status migrainosus, not intractable - improved on Qulipta.  Nurtec helpful but I would like to find a better abortive option.  1.  Migraine prevention:  Qulipta 60mg  QD 2.  Migraine rescue:  Will send prescription for Nurtec.  I have reached out to her cardiologists to see if they have any concerns about starting a triptan.  Zofran for nausea. 3.  Limit use of pain relievers to no more than 2 days out of week to prevent risk of rebound or medication-overuse headache. 4.  Keep headache diary 5.  Follow up 6 months.  skains  And allred - triptan?  History of Present Illness:  [name] is a 50 year old right-handed female with paroxysmal atrial fibrillation with RVR s/p ablation, and history of melanoma, who follows up for migraines.  UPDATE: MRI of brain with and without contrast (performed under sedation) on 09/10/2020 personally reviewed showed few scattered punctate T2/FLAIR foci within the cerebral white  matter.  Started Lenoria Chime in December. Regimen:  Tylenol/Advil/Benadryl - and goes to sleep for the night.  Intensity:  Moderate, not as severe Duration:  Several hours Frequency:  1 a week Current NSAIDS/analgesics:  Tylenol, Advil Current triptans:  none Current ergotamine:  none Current anti-emetic:  Zofran ODT 8mg  Current muscle relaxants:  none Current Antihypertensive medications:  nadolol Current Antidepressant medications:  Venlafaxine XR 75mg  daily Current Anticonvulsant medications:  none Current anti-CGRP:  Qulipta 60mg  QD, Nurtec (rescue) Current Vitamins/Herbal/Supplements:  MVI Current Antihistamines/Decongestants:  none Other therapy:  Ice, compression Hormone/birth control:  none Other medications:  Seroquel 100mg  QHS PRN  Caffeine:  2 cups coffee in AM, 1 diet Coke sometimes Diet:  Poor water intake.  Only skips meals if wakes up nauseous Exercise:  routine Depression:  controlled; Anxiety: controlled Other pain:  no Sleep:  Varies.  Uses Seroquel as needed.  HISTORY: She has had migraines since age 71.  They are typically severe right-sided pounding headache usually preceded by blurred vision/enlarged blind spot in right eye and neck pain.  Associated nausea, vomiting, photophobia and phonophobia.  They occurred on and off until college and then they have occurred infrequently, but when they would occur, she often would require an ED visit.  About 2 to 3 years ago, she began having new headaches which have progressively become more frequent.  They are moderate pulsating pain usually in right temple but sometimes behind both eyes or back of head.  No preceding aura.  Associated nausea, photophobia and phonophobia.  Often will wake up with them and will last until  the next day.  Afterwards, her head feels sore.  Change in barometric pressure is a common trigger.  Bending over aggravates the headache.  She is unable to eat or exercise.  Cold compresses or other pressure  helps relieve the pain.  Usually occurs a day prior to start of her menstrual cycle but on average occurs 3 to 4 days a month.    On 05/13/2017, she was seen in the ED for an intractable migraine.  MRI and MRA of brain without contrast personally reviewed were normal.     Past NSAIDS/analgesics:  Midrin Past abortive triptans:  sumatriptan tablet (unsure if effective) Past abortive ergotamine:  none Past muscle relaxants:  none Past anti-emetic:  Zofran Past antihypertensive medications:  Propranolol  Past antidepressant medications:  Amitriptyline, fluoxetine, Wellbutrin Past anticonvulsant medications:  none Past anti-CGRP:  none Past vitamins/Herbal/Supplements:  none Past antihistamines/decongestants:  cyproheptadine Other past therapies:  none     Family history of headache:  Sister (migraines) No family history of aneurysm.    Past Medical History: Past Medical History:  Diagnosis Date  . Anxiety   . Atrial fibrillation ([place])    adm 01/2014  . Atrial fibrillation with RVR ([place])   . Cancer ([place])    melanoma x 2 right bicep and left thigh  . Chronic constipation   . Complication of anesthesia    wakes up early from anesthesia  . Depression   . Diabetes mellitus without complication ([place])    pt states she has not been on medications for 10 years. States her A1C is always in the normal range - per pt on 09/06/20  . Headache    hx of migraines and went away and now have come back  . Insomnia   . OCD (obsessive compulsive disorder)   . SVT (supraventricular tachycardia) ([facility])     Medications: Outpatient Encounter Medications as of 12/17/2020  Medication Sig  . Atogepant (QULIPTA) 60 MG TABS Take 60 mg by mouth daily.  . Multiple Vitamins-Minerals (MULTIVITAMIN WITH MINERALS) tablet Take 1 tablet by mouth daily.  . nadolol (CORGARD) 20 MG tablet TAKE 1/2 TABLET BY MOUTH DAILY (Patient taking differently: Take 20 mg by mouth daily as needed (afib).)  . QUEtiapine  (SEROQUEL) 25 MG tablet Take 25 mg by mouth at bedtime as needed for sleep.  . Rimegepant Sulfate (NURTEC) 75 MG TBDP Take 75 mg by mouth as needed. (Patient taking differently: Take 75 mg by mouth daily as needed (migraines).)  . venlafaxine XR (EFFEXOR-XR) 75 MG 24 hr capsule Take 225 mg by mouth daily.   No facility-administered encounter medications on file as of 12/17/2020.    Allergies: No Known Allergies  Family History: Family History  Problem Relation Age of Onset  . COPD Mother   . Heart attack Father     Observations/Objective:   Height 5\' 5"  (1.651 m), weight 145 lb (65.8 kg). No acute distress.  Alert and oriented.  Speech fluent and not dysarthric.  Language intact.    Follow Up Instructions:    -I discussed the assessment and treatment plan with the patient. The patient was provided an opportunity to ask questions and all were answered. The patient agreed with the plan and demonstrated an understanding of the instructions.   The patient was advised to call back or seek an in-person evaluation if the symptoms worsen or if the condition fails to improve as anticipated.  Dudley Major, DO

## 2020-12-17 ENCOUNTER — Telehealth (INDEPENDENT_AMBULATORY_CARE_PROVIDER_SITE_OTHER): Payer: BC Managed Care – PPO | Admitting: Neurology

## 2020-12-17 ENCOUNTER — Telehealth: Payer: Self-pay

## 2020-12-17 ENCOUNTER — Encounter: Payer: Self-pay | Admitting: Neurology

## 2020-12-17 ENCOUNTER — Other Ambulatory Visit: Payer: Self-pay

## 2020-12-17 VITALS — Ht 65.0 in | Wt 145.0 lb

## 2020-12-17 DIAGNOSIS — G43009 Migraine without aura, not intractable, without status migrainosus: Secondary | ICD-10-CM | POA: Diagnosis not present

## 2020-12-17 MED ORDER — NURTEC 75 MG PO TBDP: 75.0000 mg | ORAL_TABLET | Freq: Every day | ORAL | 5 refills | Status: DC | PRN

## 2020-12-17 NOTE — Patient Instructions (Signed)
Continue Qulipta Will continue Nurtec.  If your cardiologists are okay with a triptan, then I will prescribe that. Zofran for nausea Limit use of pain relievers to no more than 2 days out of week to prevent risk of rebound or medication-overuse headache. Keep headache diary Follow up 6 months.

## 2020-12-17 NOTE — Telephone Encounter (Signed)
  ILR reached RRT 12/16/20. Patient called and advised battery is no longer working. Discussed options to explant or leave device in. States she would like to have device removed. Advised I will forward to scheduling who will call her to set up that apt. Address on file verified to send return kit.

## 2020-12-26 DIAGNOSIS — D0372 Melanoma in situ of left lower limb, including hip: Secondary | ICD-10-CM | POA: Diagnosis not present

## 2020-12-26 DIAGNOSIS — L988 Other specified disorders of the skin and subcutaneous tissue: Secondary | ICD-10-CM | POA: Diagnosis not present

## 2021-01-09 DIAGNOSIS — D3610 Benign neoplasm of peripheral nerves and autonomic nervous system, unspecified: Secondary | ICD-10-CM | POA: Diagnosis not present

## 2021-01-09 DIAGNOSIS — D485 Neoplasm of uncertain behavior of skin: Secondary | ICD-10-CM | POA: Diagnosis not present

## 2021-01-10 ENCOUNTER — Ambulatory Visit
Admission: RE | Admit: 2021-01-10 | Discharge: 2021-01-10 | Disposition: A | Discharge status: Home / Self Care | Payer: BC Managed Care – PPO | Source: Ambulatory Visit | Attending: Obstetrics and Gynecology | Admitting: Obstetrics and Gynecology

## 2021-01-10 ENCOUNTER — Other Ambulatory Visit: Payer: Self-pay

## 2021-01-10 DIAGNOSIS — N6001 Solitary cyst of right breast: Secondary | ICD-10-CM

## 2021-01-30 DIAGNOSIS — F411 Generalized anxiety disorder: Secondary | ICD-10-CM | POA: Diagnosis not present

## 2021-01-30 DIAGNOSIS — F900 Attention-deficit hyperactivity disorder, predominantly inattentive type: Secondary | ICD-10-CM | POA: Diagnosis not present

## 2021-01-30 DIAGNOSIS — F429 Obsessive-compulsive disorder, unspecified: Secondary | ICD-10-CM | POA: Diagnosis not present

## 2021-02-07 ENCOUNTER — Ambulatory Visit: Payer: BC Managed Care – PPO | Admitting: Internal Medicine

## 2021-02-07 DIAGNOSIS — R000 Tachycardia, unspecified: Secondary | ICD-10-CM

## 2021-02-07 DIAGNOSIS — I480 Paroxysmal atrial fibrillation: Secondary | ICD-10-CM

## 2021-03-11 DIAGNOSIS — F411 Generalized anxiety disorder: Secondary | ICD-10-CM | POA: Diagnosis not present

## 2021-03-11 DIAGNOSIS — F900 Attention-deficit hyperactivity disorder, predominantly inattentive type: Secondary | ICD-10-CM | POA: Diagnosis not present

## 2021-03-11 DIAGNOSIS — F429 Obsessive-compulsive disorder, unspecified: Secondary | ICD-10-CM | POA: Diagnosis not present

## 2021-04-14 ENCOUNTER — Other Ambulatory Visit: Payer: Self-pay

## 2021-04-14 ENCOUNTER — Ambulatory Visit: Payer: BC Managed Care – PPO | Admitting: Internal Medicine

## 2021-04-14 ENCOUNTER — Encounter: Payer: Self-pay | Admitting: Internal Medicine

## 2021-04-14 VITALS — BP 96/68 | HR 2 | Ht 65.0 in | Wt 153.4 lb

## 2021-04-14 DIAGNOSIS — I480 Paroxysmal atrial fibrillation: Secondary | ICD-10-CM

## 2021-04-14 DIAGNOSIS — R000 Tachycardia, unspecified: Secondary | ICD-10-CM | POA: Diagnosis not present

## 2021-04-14 HISTORY — PX: OTHER SURGICAL HISTORY: SHX169

## 2021-04-14 NOTE — Patient Instructions (Addendum)
Medication Instructions:  Your physician recommends that you continue on your current medications as directed. Please refer to the Current Medication list given to you today.  Labwork: None ordered.  Testing/Procedures: None ordered.  Follow-Up:  Your physician wants you to follow-up in: one year with Dillon Bjork, PA.  You will receive a reminder letter in the mail two months in advance. If you don't receive a letter, please call our office to schedule the follow-up appointment.  [employer]  FDA-cleared EKG at your fingertips. - [employer]   Agricultural engineer, [employer]. [email or web address]   FDA-cleared, clinical grade mobile EKG monitor: [name] is the most clinically-validated mobile EKG used by the world's leading cardiac care medical professionals.  This may be useful in monitoring palpitations.  We do not have access to have them emailed and reviewed but will be glad to review while in the office.      Implantable Loop Recorder Removal, Care After This sheet gives you information about how to care for yourself after your procedure. Your health care provider may also give you more specific instructions. If you have problems or questions, contact your health care provider. What can I expect after the procedure? After the procedure, it is common to have: Soreness or discomfort near the incision. Some swelling or bruising near the incision.  Follow these instructions at home: Incision care   Leave your outer dressing on for 24 hours.  After 24 hours you can remove your outer dressing and shower. Leave adhesive strips in place. These skin closures may need to stay in place for 1-2 weeks. If adhesive strip edges start to loosen and curl up, you may trim the loose edges.  You may remove the strips if they have not fallen off after 2 weeks. Check your incision area every day for signs of infection. Check for: Redness, swelling, or pain. Fluid or  blood. Warmth. Pus or a bad smell. Do not take baths, swim, or use a hot tub until your incision is completely healed. If your wound site starts to bleed apply pressure.      If you have any questions/concerns please call the device clinic at [phone].  Activity  Return to your normal activities.  Contact a health care provider if: You have redness, swelling, or pain around your incision. You have a fever.

## 2021-04-14 NOTE — Progress Notes (Signed)
PCP: [name], MD Primary Cardiologist: Dr Marlou Porch Primary EP: Dr Rayann Heman  [name] is a 50 y.o. female who presents today for routine electrophysiology followup.  Since last being seen in our clinic, the patient reports doing very well.  She has rare palpitations. Today, she denies symptoms of chest pain, shortness of breath,  lower extremity edema, dizziness, presyncope, or syncope.  The patient is otherwise without complaint today.   Past Medical History:  Diagnosis Date   Anxiety    Atrial fibrillation ([place])    adm 01/2014   Atrial fibrillation with RVR ([facility])    Cancer ([facility])    melanoma x 2 right bicep and left thigh   Chronic constipation    Complication of anesthesia    wakes up early from anesthesia   Depression    Diabetes mellitus without complication ([place])    pt states she has not been on medications for 10 years. States her A1C is always in the normal range - per pt on 09/06/20   Headache    hx of migraines and went away and now have come back   Insomnia    OCD (obsessive compulsive disorder)    SVT (supraventricular tachycardia) [facility])    Past Surgical History:  Procedure Laterality Date   APPENDECTOMY     CESAREAN SECTION  2007   DIAGNOSTIC LAPAROSCOPY     ELECTROPHYSIOLOGIC STUDY N/A 03/26/2015   PVI by Dr Rayann Heman for afib   LAPAROSCOPY  10/30/2011   Procedure: LAPAROSCOPY DIAGNOSTIC;  Surgeon: Lovenia Kim, MD;  Location: [place] [facility];  Service: Gynecology;  Laterality: N/A;   LOOP RECORDER INSERTION N/A 07/05/2017   Procedure: LOOP RECORDER INSERTION;  Surgeon: [name], MD;  Location: [facility];  Service: Cardiovascular;  Laterality: N/A;   RADIOLOGY WITH ANESTHESIA N/A 09/10/2020   Procedure: MRI BRAIN WITH AND WITHOUT CONTRAST;  Surgeon: Radiologist, Medication, MD;  Location: [place];  Service: Radiology;  Laterality: N/A;   TEE WITHOUT CARDIOVERSION N/A 03/25/2015   Procedure: TRANSESOPHAGEAL ECHOCARDIOGRAM (TEE);  Surgeon: Thayer Headings, MD;   Location: [place];  Service: Cardiovascular;  Laterality: N/A;   WISDOM TOOTH EXTRACTION      ROS- all systems are reviewed and negatives except as per HPI above  Current Outpatient Medications  Medication Sig Dispense Refill   Atogepant (QULIPTA) 60 MG TABS Take 60 mg by mouth daily. 30 tablet 5   Multiple Vitamins-Minerals (MULTIVITAMIN WITH MINERALS) tablet Take 1 tablet by mouth daily.     nadolol (CORGARD) 20 MG tablet TAKE 1/2 TABLET BY MOUTH DAILY (Patient taking differently: Take 20 mg by mouth daily as needed (afib).) 45 tablet 3   QUEtiapine (SEROQUEL) 25 MG tablet Take 25 mg by mouth at bedtime as needed for sleep.     Rimegepant Sulfate (NURTEC) 75 MG TBDP Take 75 mg by mouth daily as needed (Maximum 1 tablet in 24 hours.). 8 tablet 5   venlafaxine XR (EFFEXOR-XR) 75 MG 24 hr capsule Take 225 mg by mouth daily.  0   No current facility-administered medications for this visit.    Physical Exam: Vitals:   04/14/21 1054  BP: 96/68  Pulse: (!) 2  SpO2: 96%  Weight: 153 lb 6.4 oz (69.6 kg)  Height: '5\' 5"'$  (1.651 m)    GEN- The patient is well appearing, alert and oriented x 3 today.   Head- normocephalic, atraumatic Eyes-  Sclera clear, conjunctiva pink Ears- hearing intact Oropharynx- clear Lungs- Clear to ausculation  bilaterally, normal work of breathing Heart- Regular rate and rhythm, no murmurs, rubs or gallops, PMI not laterally displaced GI- soft, NT, ND, + BS Extremities- no clubbing, cyanosis, or edema  Wt Readings from Last 3 Encounters:  04/14/21 153 lb 6.4 oz (69.6 kg)  12/17/20 145 lb (65.8 kg)  08/15/20 156 lb 3.2 oz (70.9 kg)    EKG tracing ordered today is personally reviewed and shows sinus  Assessment and Plan:  Paroxysmal atrial fibrillation Well controlled post ablation ILR previously showed burden of 0%.  Chads2vasc score is 1.  She does not require [place].   Her ILR is at RRT.  We discussed options of removal and reimplantation today.   She is clear that she wishes to have the device removed.  She has [employer] which we can use to follow for arrhythmias going forward.  [name] MD, [facility] 04/14/2021 11:11 AM    PROCEDURES:   1. Implantable loop recorder explantation        DESCRIPTION OF PROCEDURE:  Informed written consent was obtained.  The patient required no sedation for the procedure today.   The patients left chest was therefore prepped and draped in the usual sterile fashion.  The skin overlying the ILR monitor was infiltrated with lidocaine for local analgesia.  A 0.5-cm incision was made over the site.  The previously implanted ILR was exposed and removed using a combination of sharp and blunt dissection.  Steri- Strips and a sterile dressing were then applied. EBL<39m.  There were no early apparent complications.   Pts spouse was present in the room for the entire procedure today.    CONCLUSIONS:   1. Successful explantation of a Medtronic Reveal LINQ implantable loop recorder   2. No early apparent complications.        JThompson GrayerMD, FSutter [facility]8/22/2022 11:24 AM

## 2021-04-22 DIAGNOSIS — R000 Tachycardia, unspecified: Secondary | ICD-10-CM | POA: Diagnosis not present

## 2021-04-22 DIAGNOSIS — R42 Dizziness and giddiness: Secondary | ICD-10-CM | POA: Diagnosis not present

## 2021-04-22 DIAGNOSIS — W19XXXA Unspecified fall, initial encounter: Secondary | ICD-10-CM | POA: Diagnosis not present

## 2021-05-06 ENCOUNTER — Telehealth: Payer: Self-pay

## 2021-05-06 NOTE — Telephone Encounter (Signed)
New message   [name] (Key: [id number]) Nurtec '75MG'$  dispersible tablets   Form [employer] ePA Form (2017 NCPDP) Created 11 minutes ago Sent to Plan 5 minutes ago Plan Response 5 minutes ago Submit Clinical Questions 1 minute ago Determination Wait for Determination Please wait for [employer] 2017 to return a determination.

## 2021-05-09 NOTE — Telephone Encounter (Signed)
F/u   Message from Plan  Document: EPA_ETC_V 1 Decision Notes: This drug is not on our list of covered drugs, also known as a formulary. Our Coverage Determinations - Exceptions policy is used to decide if a not-covered drug can be approved. The conditions in this policy have not been met. From the records that we have received, these reasons caused the denial: 1) All covered drugs used for your health issue have not been tried and failed. Other drugs that can be used are Reyvow and Ubrelvy. Prior authorization may be required and quantity limits may apply. Please look at the formulary to see what drugs are covered. Prior authorization may be required and quantity limits may apply to covered drugs. ADDITIONAL INFORMATION FOR YOUR HEALTH CARE PROVIDER: This request has not been approved because this drug is not on formulary. An exception to allow coverage of a non-formulary drug may be granted if all conditions in our Coverage Determinations - Exceptions policy are met. From the information we have received, the member does not meet number 2 of the exception policy criteria. The reason for denial is explained to the member above. The criteria from the policy are listed here. 1) The drug is being used for a condition approved by the [place] (FDA). 2) All formulary alternatives have been tried or medical reasons have been provided why all other covered drugs cannot be tried. 3) Records have been received showing the requested drug is medically necessary. These should include relevant medical history and lab results, past treatments tried with dates of trial and responses, and any other evidence to show the covered drugs are likely to be ineffective or unsafe for the member. 4) Prescription drug samples were not used to establish treatment. Since criteria have not been met, we are unable to approve coverage for this drug at this time. Please refer to the formulary for information  on what is covered. Prior authorization may be required and.Marland KitchenMarland Kitchen

## 2021-05-13 MED ORDER — UBRELVY 100 MG PO TABS: 100.0000 mg | ORAL_TABLET | ORAL | 5 refills | Status: DC | PRN

## 2021-05-13 NOTE — Telephone Encounter (Signed)
We will have her try Ubrelvy 100mg  - take 1 tablet as needed.  May repeat in 2 hours.  Maximum 2 tablets in 24 hours.  Quantity 16, refill 5.    Per pt the Nurtec she gets through [facility]. Per pt the nurtec not working that well. Will try the Urbelvy.

## 2021-05-13 NOTE — Addendum Note (Signed)
Addended by: Venetia Night on: 05/13/2021 09:24 AM   Modules accepted: Orders

## 2021-05-22 ENCOUNTER — Telehealth: Payer: Self-pay

## 2021-05-22 NOTE — Telephone Encounter (Signed)
New message   [facility] Key: [id number] - PA Case ID: [id number] - Rx #: [id number] Need help? Call us at [phone]  Status Sent to Plan today Drug Ubrelvy 100MG  tablets Form [employer] ePA Form (2017 NCPDP) Original Claim Info 53

## 2021-05-27 NOTE — Telephone Encounter (Signed)
F/ u  Outcome Approved on September 30  Your request has been approved Drug Ubrelvy 100MG  tablets Form [employer] ePA Form [phone] NCPDP)

## 2021-05-28 MED ORDER — UBRELVY 100 MG PO TABS: 100.0000 mg | ORAL_TABLET | ORAL | 5 refills | Status: DC | PRN

## 2021-05-28 NOTE — Telephone Encounter (Signed)
Letter received from [facility] health, After reviewing the approval it was determined that the submitted request does not meet criteria.  Script has to be written 10 tab for 30 days.  Dr.Jaffe, Can the script be changed to meet the critera?

## 2021-05-28 NOTE — Addendum Note (Signed)
Addended by: Venetia Night on: 05/28/2021 09:28 AM   Modules accepted: Orders

## 2021-06-18 DIAGNOSIS — F411 Generalized anxiety disorder: Secondary | ICD-10-CM | POA: Diagnosis not present

## 2021-06-18 DIAGNOSIS — F900 Attention-deficit hyperactivity disorder, predominantly inattentive type: Secondary | ICD-10-CM | POA: Diagnosis not present

## 2021-06-18 DIAGNOSIS — F429 Obsessive-compulsive disorder, unspecified: Secondary | ICD-10-CM | POA: Diagnosis not present

## 2021-06-19 NOTE — Progress Notes (Deleted)
NEUROLOGY FOLLOW UP OFFICE NOTE  [name] [id number]  Assessment/Plan:  Migraine without aura, without status migrainosus, not intractable  Migraine prevention:  Qulipta 60mg  daily Migraine rescue:  *** Limit use of pain relievers to no more than 2 days out of week to prevent risk of rebound or medication-overuse headache. Keep headache diary Follow up ***  Subjective:  [name] is a 50 year old right-handed female with paroxysmal atrial fibrillation with RVR s/p ablation, and history of melanoma, who follows up for migraines.   UPDATE: Unable to take Nurtec as not covered by her insurance.  Instead, she was started on Ubrelvy ***    Regimen:  Tylenol/Advil/Benadryl - and goes to sleep for the night.  Intensity:  Moderate, not as severe Duration:  Several hours Frequency:  1 a week Current NSAIDS/analgesics:  Tylenol, Advil Current triptans:  none Current ergotamine:  none Current anti-emetic:  Zofran ODT 8mg  Current muscle relaxants:  none Current Antihypertensive medications:  nadolol Current Antidepressant medications:  Venlafaxine XR 75mg  daily Current Anticonvulsant medications:  none Current anti-CGRP:  Qulipta 60mg  QD, Nurtec (rescue) Current Vitamins/Herbal/Supplements:  MVI Current Antihistamines/Decongestants:  none Other therapy:  Ice, compression Hormone/birth control:  none Other medications:  Seroquel 100mg  QHS PRN   Caffeine:  2 cups coffee in AM, 1 diet Coke sometimes Diet:  Poor water intake.  Only skips meals if wakes up nauseous Exercise:  routine Depression:  controlled; Anxiety: controlled Other pain:  no Sleep:  Varies.  Uses Seroquel as needed.   HISTORY:  She has had migraines since age 11.  They are typically severe right-sided pounding headache usually preceded by blurred vision/enlarged blind spot in right eye and neck pain.  Associated nausea, vomiting, photophobia and phonophobia.  They occurred on and off until college and then  they have occurred infrequently, but when they would occur, she often would require an ED visit.  About 2 to 3 years ago, she began having new headaches which have progressively become more frequent.  They are moderate pulsating pain usually in right temple but sometimes behind both eyes or back of head.  No preceding aura.  Associated nausea, photophobia and phonophobia.  Often will wake up with them and will last until the next day.  Afterwards, her head feels sore.  Change in barometric pressure is a common trigger.  Bending over aggravates the headache.  She is unable to eat or exercise.  Cold compresses or other pressure helps relieve the pain.  Usually occurs a day prior to start of her menstrual cycle but on average occurs 3 to 4 days a month.  MRI of brain with and without contrast (performed under sedation) on 09/10/2020 showed few scattered punctate T2/FLAIR foci within the cerebral white matter.   On 05/13/2017, she was seen in the ED for an intractable migraine.  MRI and MRA of brain without contrast were unremarkable.       Past NSAIDS/analgesics:  Midrin Past abortive triptans:  sumatriptan tablet (unsure if effective) Past abortive ergotamine:  none Past muscle relaxants:  none Past anti-emetic:  Zofran Past antihypertensive medications:  Propranolol  Past antidepressant medications:  Amitriptyline, fluoxetine, Wellbutrin Past anticonvulsant medications:  none Past anti-CGRP:  none Past vitamins/Herbal/Supplements:  none Past antihistamines/decongestants:  cyproheptadine Other past therapies:  none      Family history of headache:  Sister (migraines) No family history of aneurysm.    PAST MEDICAL HISTORY: Past Medical History:  Diagnosis Date   Anxiety  Atrial fibrillation ([place])    adm 01/2014   Atrial fibrillation with RVR (HCC)    Cancer (HCC)    melanoma x 2 right bicep and left thigh   Chronic constipation    Complication of anesthesia    wakes up early from  anesthesia   Depression    Diabetes mellitus without complication ([place])    pt states she has not been on medications for 10 years. States her A1C is always in the normal range - per pt on 09/06/20   Headache    hx of migraines and went away and now have come back   Insomnia    OCD (obsessive compulsive disorder)    SVT (supraventricular tachycardia) (HCC)     MEDICATIONS: Current Outpatient Medications on File Prior to Visit  Medication Sig Dispense Refill   Atogepant (QULIPTA) 60 MG TABS Take 60 mg by mouth daily. 30 tablet 5   Multiple Vitamins-Minerals (MULTIVITAMIN WITH MINERALS) tablet Take 1 tablet by mouth daily.     nadolol (CORGARD) 20 MG tablet TAKE 1/2 TABLET BY MOUTH DAILY (Patient taking differently: Take 20 mg by mouth daily as needed (afib).) 45 tablet 3   QUEtiapine (SEROQUEL) 25 MG tablet Take 25 mg by mouth at bedtime as needed for sleep.     Rimegepant Sulfate (NURTEC) 75 MG TBDP Take 75 mg by mouth daily as needed (Maximum 1 tablet in 24 hours.). 8 tablet 5   Ubrogepant (UBRELVY) 100 MG TABS Take 100 mg by mouth as needed (Take 1 tablet as needed. May repeat in 2 hours. Maximum 2 tablets in 24 hours.). 8 tablet 5   venlafaxine XR (EFFEXOR-XR) 75 MG 24 hr capsule Take 225 mg by mouth daily.  0   No current facility-administered medications on file prior to visit.    ALLERGIES: No Known Allergies  FAMILY HISTORY: Family History  Problem Relation Age of Onset   COPD Mother    Heart attack Father       Objective:  *** General: No acute distress.  Patient appears ***-groomed.   Head:  Normocephalic/atraumatic Eyes:  Fundi examined but not visualized Neck: supple, no paraspinal tenderness, full range of motion Heart:  Regular rate and rhythm Lungs:  Clear to auscultation bilaterally Back: No paraspinal tenderness Neurological Exam: alert and oriented to person, place, and time.  Speech fluent and not dysarthric, language intact.  CN II-XII intact. Bulk and  tone normal, muscle strength 5/5 throughout.  Sensation to light touch intact.  Deep tendon reflexes 2+ throughout, toes downgoing.  Finger to nose testing intact.  Gait normal, Romberg negative.   Metta Clines, DO  CC: [name], MD

## 2021-06-23 ENCOUNTER — Ambulatory Visit: Payer: BC Managed Care – PPO | Admitting: Neurology

## 2021-07-01 DIAGNOSIS — L814 Other melanin hyperpigmentation: Secondary | ICD-10-CM | POA: Diagnosis not present

## 2021-07-01 DIAGNOSIS — D225 Melanocytic nevi of trunk: Secondary | ICD-10-CM | POA: Diagnosis not present

## 2021-07-01 DIAGNOSIS — Z85820 Personal history of malignant melanoma of skin: Secondary | ICD-10-CM | POA: Diagnosis not present

## 2021-07-01 DIAGNOSIS — D2271 Melanocytic nevi of right lower limb, including hip: Secondary | ICD-10-CM | POA: Diagnosis not present

## 2021-07-01 DIAGNOSIS — D2272 Melanocytic nevi of left lower limb, including hip: Secondary | ICD-10-CM | POA: Diagnosis not present

## 2021-07-01 DIAGNOSIS — D485 Neoplasm of uncertain behavior of skin: Secondary | ICD-10-CM | POA: Diagnosis not present

## 2021-08-13 DIAGNOSIS — Z85820 Personal history of malignant melanoma of skin: Secondary | ICD-10-CM | POA: Diagnosis not present

## 2021-08-13 DIAGNOSIS — B0089 Other herpesviral infection: Secondary | ICD-10-CM | POA: Diagnosis not present

## 2021-08-13 DIAGNOSIS — I788 Other diseases of capillaries: Secondary | ICD-10-CM | POA: Diagnosis not present

## 2021-08-14 DIAGNOSIS — F411 Generalized anxiety disorder: Secondary | ICD-10-CM | POA: Diagnosis not present

## 2021-08-14 DIAGNOSIS — F900 Attention-deficit hyperactivity disorder, predominantly inattentive type: Secondary | ICD-10-CM | POA: Diagnosis not present

## 2021-08-14 DIAGNOSIS — F429 Obsessive-compulsive disorder, unspecified: Secondary | ICD-10-CM | POA: Diagnosis not present

## 2021-08-26 DIAGNOSIS — F411 Generalized anxiety disorder: Secondary | ICD-10-CM | POA: Diagnosis not present

## 2021-08-26 DIAGNOSIS — F429 Obsessive-compulsive disorder, unspecified: Secondary | ICD-10-CM | POA: Diagnosis not present

## 2021-08-26 DIAGNOSIS — F900 Attention-deficit hyperactivity disorder, predominantly inattentive type: Secondary | ICD-10-CM | POA: Diagnosis not present

## 2021-09-03 ENCOUNTER — Encounter: Payer: Self-pay | Admitting: Neurology

## 2021-09-04 NOTE — Progress Notes (Signed)
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  1  Will start with NCV-EMG of right upper and lower extremities 2  Further recommendations pending results.    Subjective:  [name] is a 51 year old right-handed female with paroxysmal atrial fibrillation with RVR s/p ablation, and history of melanoma whom I see for migraines, presents today for right upper and lower extremity pain.  In November, she developed deep severe sharp right ear pain.  No congestion or change in hearing.  She thought that it could have been a nerve problem, possibly zoster, but she never developed a rash.  It lasted about a week and then resolved.  Around the same time, she developed right elbow pain with pain and paresthesias radiating over her forearm to the last two digits of her right hand.  Her elbow is quite tender to touch.  She also endorses weakness with difficulty opening jars.  She has right sided neck pain that isn't new.  She reports history of possible Raynaud's involving the fifth digit.    About a week ago, she started developing pain in the anterior right thigh down to the knee.  It is a burning pain.  Thigh sore to touch.  It is not aggravated by bearing weight, only if touching her thigh.  Denies back pain, numbness, tingling, leg weakness or bowel/bladder dysfunction.  She notes that she had a melanoma removed on her right arm just above the elbow as well as above the right knee.     PAST MEDICAL HISTORY: Past Medical History:  Diagnosis Date   Anxiety    Atrial fibrillation ([place])    adm 01/2014   Atrial fibrillation with RVR (HCC)    Cancer (HCC)    melanoma x 2 right bicep and left thigh   Chronic constipation    Complication of anesthesia    wakes up early from anesthesia    Depression    Diabetes mellitus without complication ([place])    pt states she has not been on medications for 10 years. States her A1C is always in the normal range - per pt on 09/06/20   Headache    hx of migraines and went away and now have come back   Insomnia    OCD (obsessive compulsive disorder)    SVT (supraventricular tachycardia) (HCC)     MEDICATIONS: Current Outpatient Medications on File Prior to Visit  Medication Sig Dispense Refill   Atogepant (QULIPTA) 60 MG TABS Take 60 mg by mouth daily. 30 tablet 5   Multiple Vitamins-Minerals (MULTIVITAMIN WITH MINERALS) tablet Take 1 tablet by mouth daily.     nadolol (CORGARD) 20 MG tablet TAKE 1/2 TABLET BY MOUTH DAILY (Patient taking differently: Take 20 mg by mouth daily as needed (afib).) 45 tablet 3   QUEtiapine (SEROQUEL) 25 MG tablet Take 25 mg by mouth at bedtime as needed for sleep.     Rimegepant Sulfate (NURTEC) 75 MG TBDP Take 75 mg by mouth daily as needed (Maximum 1 tablet in 24 hours.). 8 tablet 5   Ubrogepant (UBRELVY) 100 MG TABS Take 100 mg by mouth as needed (Take 1 tablet as needed. May repeat in 2 hours. Maximum 2 tablets in 24 hours.). 8 tablet 5   venlafaxine XR (EFFEXOR-XR) 75 MG 24 hr  capsule Take 225 mg by mouth daily.  0   No current facility-administered medications on file prior to visit.    ALLERGIES: No Known Allergies  FAMILY HISTORY: Family History  Problem Relation Age of Onset   COPD Mother    Heart attack Father       Objective:  Blood pressure 116/70, pulse (!) 110, height 5\' 5"  (1.651 m), weight 156 lb 12.8 oz (71.1 kg), SpO2 94 %. General: No acute distress.  Patient appears well-groomed.   Head:  Normocephalic/atraumatic Eyes:  Fundi examined but not visualized Neck: supple, no paraspinal tenderness, full range of motion Heart:  Regular rate and rhythm Lungs:  Clear to auscultation bilaterally Back: No paraspinal tenderness Neurological Exam: alert and oriented to person, place, and  time.  Speech fluent and not dysarthric, language intact.  CN II-XII intact. Bulk and tone normal, muscle strength 5/5 throughout.  Sensation to light touch, pinprick and vibration intact.  Deep tendon reflexes 2+ throughout, toes downgoing.  Finger to nose testing intact.  Gait normal, Romberg negative.  Tinel's sign at right elbow negative but elbow was painful to touch.   Laura Clines, DO  CC: Laura Few, MD

## 2021-09-05 ENCOUNTER — Encounter: Payer: Self-pay | Admitting: Neurology

## 2021-09-05 ENCOUNTER — Other Ambulatory Visit: Payer: Self-pay

## 2021-09-05 ENCOUNTER — Ambulatory Visit: Payer: BC Managed Care – PPO | Admitting: Neurology

## 2021-09-05 VITALS — BP 116/70 | HR 110 | Ht 65.0 in | Wt 156.8 lb

## 2021-09-05 DIAGNOSIS — G5621 Lesion of ulnar nerve, right upper limb: Secondary | ICD-10-CM

## 2021-09-05 DIAGNOSIS — B0221 Postherpetic geniculate ganglionitis: Secondary | ICD-10-CM | POA: Diagnosis not present

## 2021-09-05 DIAGNOSIS — M79651 Pain in right thigh: Secondary | ICD-10-CM | POA: Diagnosis not present

## 2021-09-05 NOTE — Patient Instructions (Addendum)
I would check nerve conduction study of right arm and leg.  Further recommendations pending results.  Keep follow up in May.

## 2021-09-23 ENCOUNTER — Encounter: Payer: BC Managed Care – PPO | Admitting: Neurology

## 2021-10-14 ENCOUNTER — Ambulatory Visit: Payer: BC Managed Care – PPO | Admitting: Neurology

## 2021-10-14 ENCOUNTER — Other Ambulatory Visit: Payer: Self-pay

## 2021-10-14 DIAGNOSIS — M79651 Pain in right thigh: Secondary | ICD-10-CM

## 2021-10-14 DIAGNOSIS — G5621 Lesion of ulnar nerve, right upper limb: Secondary | ICD-10-CM | POA: Diagnosis not present

## 2021-10-14 NOTE — Procedures (Signed)
[facility]  [address]  [place],  [address] Tel: [phone] Fax:  [phone] Test Date:  10/14/2021  Patient: [name] DOB: 1970/10/15 Physician: Narda Amber, DO  Sex: Female Height: 5\' 5"  Ref Phys: Metta Clines, D.O.  ID#: [id number]   Technician:    Patient Complaints: This is a 51 year old female referred for evaluation of right arm paresthesias and right thigh pain.  She reports right thigh pain has resolved.  NCV & EMG Findings: Extensive electrodiagnostic testing of the right upper extremity shows: Right median, ulnar, and mixed palmar sensory responses are within normal limits. Right median and ulnar motor responses are within normal limits. There is no evidence of active or chronic motor axonal loss changes affecting any of the tested muscles.  Motor unit configuration and recruitment pattern is within normal limits.   Impression: This is a normal study of the right upper extremity.  In particular, there is no evidence of carpal tunnel syndrome, ulnar neuropathy, or cervical radiculopathy. Patient requested not to perform electrodiagnostic testing of the right lower extremity as symptoms have resolved.     ___________________________ Narda Amber, DO    Nerve Conduction Studies Anti Sensory Summary Table   Stim Site NR Peak (ms) Norm Peak (ms) P-T Amp (V) Norm P-T Amp  Right Median Anti Sensory (2nd Digit)  35C  Wrist    2.8 <3.4 52.2 >20  Right Ulnar Anti Sensory (5th Digit)  35C  Wrist    2.5 <3.1 41.8 >12   Motor Summary Table   Stim Site NR Onset (ms) Norm Onset (ms) O-P Amp (mV) Norm O-P Amp Site1 Site2 Delta-0 (ms) Dist (cm) Vel (m/s) Norm Vel (m/s)  Right Median Motor (Abd Poll Brev)  35C  Wrist    2.9 <3.9 10.9 >6 Elbow Wrist 4.5 29.0 64 >50  Elbow    7.4  9.7         Right Ulnar Motor (Abd Dig Minimi)  35C  Wrist    2.1 <3.1 12.1 >7 B Elbow Wrist 3.2 22.0 69 >50  B Elbow    5.3  11.7  A Elbow B Elbow 1.6 10.0  62 >50  A Elbow    6.9  11.6          Comparison Summary Table   Stim Site NR Peak (ms) Norm Peak (ms) P-T Amp (V) Site1 Site2 Delta-P (ms) Norm Delta (ms)  Right Median/Ulnar Palm Comparison (Wrist - 8cm)  35C  Median Palm    1.3 <2.2 69.6 Median Palm Ulnar Palm 0.1   Ulnar Palm    1.4 <2.2 39.5       EMG   Side Muscle Ins Act Fibs Psw Fasc Number Recrt Dur Dur. Amp Amp. Poly Poly. Comment  Right 1stDorInt Nml Nml Nml Nml Nml Nml Nml Nml Nml Nml Nml Nml N/A  Right PronatorTeres Nml Nml Nml Nml Nml Nml Nml Nml Nml Nml Nml Nml N/A  Right Biceps Nml Nml Nml Nml Nml Nml Nml Nml Nml Nml Nml Nml N/A  Right Triceps Nml Nml Nml Nml Nml Nml Nml Nml Nml Nml Nml Nml N/A  Right Deltoid Nml Nml Nml Nml Nml Nml Nml Nml Nml Nml Nml Nml N/A      Waveforms:

## 2021-10-15 NOTE — Progress Notes (Signed)
Pt advised of EMG results.

## 2021-10-21 DIAGNOSIS — F429 Obsessive-compulsive disorder, unspecified: Secondary | ICD-10-CM | POA: Diagnosis not present

## 2021-10-21 DIAGNOSIS — F900 Attention-deficit hyperactivity disorder, predominantly inattentive type: Secondary | ICD-10-CM | POA: Diagnosis not present

## 2021-10-21 DIAGNOSIS — F411 Generalized anxiety disorder: Secondary | ICD-10-CM | POA: Diagnosis not present

## 2021-11-12 DIAGNOSIS — F900 Attention-deficit hyperactivity disorder, predominantly inattentive type: Secondary | ICD-10-CM | POA: Diagnosis not present

## 2021-11-12 DIAGNOSIS — F429 Obsessive-compulsive disorder, unspecified: Secondary | ICD-10-CM | POA: Diagnosis not present

## 2021-11-12 DIAGNOSIS — F411 Generalized anxiety disorder: Secondary | ICD-10-CM | POA: Diagnosis not present

## 2021-11-27 ENCOUNTER — Other Ambulatory Visit: Payer: Self-pay | Admitting: Obstetrics and Gynecology

## 2021-11-27 DIAGNOSIS — Z1231 Encounter for screening mammogram for malignant neoplasm of breast: Secondary | ICD-10-CM

## 2021-12-18 ENCOUNTER — Encounter: Payer: Self-pay | Admitting: Family Medicine

## 2021-12-18 ENCOUNTER — Other Ambulatory Visit (HOSPITAL_COMMUNITY)
Admission: RE | Admit: 2021-12-18 | Discharge: 2021-12-18 | Disposition: A | Discharge status: Home / Self Care | Payer: BC Managed Care – PPO | Source: Ambulatory Visit | Attending: Family Medicine | Admitting: Family Medicine

## 2021-12-18 ENCOUNTER — Ambulatory Visit: Payer: BC Managed Care – PPO | Admitting: Family Medicine

## 2021-12-18 VITALS — BP 126/87 | HR 103 | Ht 65.0 in | Wt 156.7 lb

## 2021-12-18 DIAGNOSIS — K317 Polyp of stomach and duodenum: Secondary | ICD-10-CM | POA: Insufficient documentation

## 2021-12-18 DIAGNOSIS — N809 Endometriosis, unspecified: Secondary | ICD-10-CM | POA: Diagnosis not present

## 2021-12-18 DIAGNOSIS — Z01419 Encounter for gynecological examination (general) (routine) without abnormal findings: Secondary | ICD-10-CM

## 2021-12-18 DIAGNOSIS — R61 Generalized hyperhidrosis: Secondary | ICD-10-CM | POA: Diagnosis not present

## 2021-12-18 DIAGNOSIS — K5900 Constipation, unspecified: Secondary | ICD-10-CM | POA: Insufficient documentation

## 2021-12-18 DIAGNOSIS — K5730 Diverticulosis of large intestine without perforation or abscess without bleeding: Secondary | ICD-10-CM | POA: Insufficient documentation

## 2021-12-18 DIAGNOSIS — Z124 Encounter for screening for malignant neoplasm of cervix: Secondary | ICD-10-CM | POA: Insufficient documentation

## 2021-12-18 DIAGNOSIS — C439 Malignant melanoma of skin, unspecified: Secondary | ICD-10-CM | POA: Insufficient documentation

## 2021-12-18 DIAGNOSIS — K2980 Duodenitis without bleeding: Secondary | ICD-10-CM | POA: Insufficient documentation

## 2021-12-18 NOTE — Progress Notes (Signed)
Subjective:  ?  ? [name] is a 51 y.o. female and is here for a comprehensive physical exam. The patient reports problems - night sweats, hair falling our, weight gain, dry skin and fatigue.  Has strong FH of thyroid issues. Also, still having cycles, that are light and has h/o Novasure ablation 10 years ago. Also with h/o endoemtriosis and notes that cycles have a lot of associated cramping. ? ? ?The following portions of the patient's history were reviewed and updated as appropriate: allergies, current medications, past family history, past medical history, past social history, past surgical history, and problem list. ? ?Review of Systems ?Pertinent items noted in HPI and remainder of comprehensive ROS otherwise negative.  ? ?Objective:  ? ? BP 126/87   Pulse (!) 103   Ht '5\' 5"'$  (1.651 m)   Wt 156 lb 11.2 oz (71.1 kg)   BMI 26.08 kg/m?  ?General appearance: alert, cooperative, and appears stated age ?Head: Normocephalic, without obvious abnormality, atraumatic ?Neck: no adenopathy, supple, symmetrical, trachea midline, and thyroid not enlarged, symmetric, no tenderness/mass/nodules ?Lungs: clear to auscultation bilaterally ?Breasts: normal appearance, no masses or tenderness ?Heart: regular rate and rhythm, S1, S2 normal, no murmur, click, rub or gallop ?Abdomen: soft, non-tender; bowel sounds normal; no masses,  no organomegaly ?Pelvic: cervix normal in appearance, external genitalia normal, no adnexal masses or tenderness, no cervical motion tenderness, uterus normal size, shape, and consistency, vagina normal without discharge, and uterus is 10 wk size mobile ?Extremities: extremities normal, atraumatic, no cyanosis or edema ?Pulses: 2+ and symmetric ?Skin: Skin color, texture, turgor normal. No rashes or lesions or dry ?Lymph nodes: Cervical, supraclavicular, and axillary nodes normal. ?Neurologic: Grossly normal  ?  ?Assessment:  ? ? Healthy female exam.    ?  ?Plan:  ? ?Problem List Items Addressed  This Visit   ? ?  ? Unprioritized  ? Night sweats  ?  99203 - check for menopause--as many of her symptoms may be related to this. ? ?  ?  ? Relevant Orders  ? Follicle stimulating hormone (Completed)  ? Endometriosis determined by laparoscopy  ?  99203 - will likely resolve with menopause--does not want to pursue treatment at this time. Consider daily oral progestins if needed. ? ?  ?  ? ?Other Visit Diagnoses   ? ? Screening for malignant neoplasm of cervix    -  Primary  ? Relevant Orders  ? Cytology - PAP( [place])  ? Encounter for gynecological examination without abnormal finding      ? Check her thyroid in addition to annual labs as many sx's sound like low thyroid.  ? Relevant Orders  ? CBC (Completed)  ? TSH (Completed)  ? Hemoglobin A1c (Completed)  ? Comprehensive metabolic panel (Completed)  ? Lipid panel (Completed)  ? ?  ? ? ?  ?See After Visit Summary for Counseling Recommendations  ? ?

## 2021-12-19 DIAGNOSIS — N809 Endometriosis, unspecified: Secondary | ICD-10-CM | POA: Insufficient documentation

## 2021-12-19 DIAGNOSIS — R61 Generalized hyperhidrosis: Secondary | ICD-10-CM | POA: Insufficient documentation

## 2021-12-19 LAB — COMPREHENSIVE METABOLIC PANEL
ALT: 12 IU/L (ref 0–32)
AST: 15 IU/L (ref 0–40)
Albumin/Globulin Ratio: 2.1 (ref 1.2–2.2)
Albumin: 4.6 g/dL (ref 3.8–4.8)
Alkaline Phosphatase: 50 IU/L (ref 44–121)
BUN/Creatinine Ratio: 21 (ref 9–23)
BUN: 15 mg/dL (ref 6–24)
Bilirubin Total: 0.2 mg/dL (ref 0.0–1.2)
CO2: 24 mmol/L (ref 20–29)
Calcium: 9.2 mg/dL (ref 8.7–10.2)
Chloride: 100 mmol/L (ref 96–106)
Creatinine, Ser: 0.72 mg/dL (ref 0.57–1.00)
Globulin, Total: 2.2 g/dL (ref 1.5–4.5)
Glucose: 78 mg/dL (ref 70–99)
Potassium: 3.8 mmol/L (ref 3.5–5.2)
Sodium: 140 mmol/L (ref 134–144)
Total Protein: 6.8 g/dL (ref 6.0–8.5)
eGFR: 102 mL/min/{1.73_m2} (ref >59)

## 2021-12-19 LAB — CBC
Hematocrit: 40.9 % (ref 34.0–46.6)
Hemoglobin: 13.6 g/dL (ref 11.1–15.9)
MCH: 29.7 pg (ref 26.6–33.0)
MCHC: 33.3 g/dL (ref 31.5–35.7)
MCV: 89 fL (ref 79–97)
Platelets: 245 10*3/uL (ref 150–450)
RBC: 4.58 x10E6/uL (ref 3.77–5.28)
RDW: 13 % (ref 11.7–15.4)
WBC: 7.8 10*3/uL (ref 3.4–10.8)

## 2021-12-19 LAB — HEMOGLOBIN A1C
Est. average glucose Bld gHb Est-mCnc: 108 mg/dL
Hgb A1c MFr Bld: 5.4 % (ref 4.8–5.6)

## 2021-12-19 LAB — LIPID PANEL
Chol/HDL Ratio: 2.8 ratio (ref 0.0–4.4)
Cholesterol, Total: 210 mg/dL — ABNORMAL HIGH (ref 100–199)
HDL: 75 mg/dL (ref >39)
LDL Chol Calc (NIH): 117 mg/dL — ABNORMAL HIGH (ref 0–99)
Triglycerides: 105 mg/dL (ref 0–149)
VLDL Cholesterol Cal: 18 mg/dL (ref 5–40)

## 2021-12-19 LAB — TSH: TSH: 2.01 u[IU]/mL (ref 0.450–4.500)

## 2021-12-19 LAB — FOLLICLE STIMULATING HORMONE: FSH: 13.9 m[IU]/mL

## 2021-12-19 NOTE — Assessment & Plan Note (Signed)
99203 - will likely resolve with menopause--does not want to pursue treatment at this time. Consider daily oral progestins if needed. ?

## 2021-12-19 NOTE — Assessment & Plan Note (Signed)
99203 - check for menopause--as many of her symptoms may be related to this. ?

## 2021-12-22 LAB — CYTOLOGY - PAP
Comment: NEGATIVE
Diagnosis: NEGATIVE
High risk HPV: NEGATIVE

## 2021-12-25 DIAGNOSIS — F411 Generalized anxiety disorder: Secondary | ICD-10-CM | POA: Diagnosis not present

## 2021-12-25 DIAGNOSIS — F900 Attention-deficit hyperactivity disorder, predominantly inattentive type: Secondary | ICD-10-CM | POA: Diagnosis not present

## 2021-12-25 DIAGNOSIS — F429 Obsessive-compulsive disorder, unspecified: Secondary | ICD-10-CM | POA: Diagnosis not present

## 2021-12-29 ENCOUNTER — Other Ambulatory Visit: Payer: Self-pay | Admitting: Neurology

## 2021-12-30 DIAGNOSIS — L718 Other rosacea: Secondary | ICD-10-CM | POA: Diagnosis not present

## 2021-12-30 DIAGNOSIS — D485 Neoplasm of uncertain behavior of skin: Secondary | ICD-10-CM | POA: Diagnosis not present

## 2021-12-30 DIAGNOSIS — D2272 Melanocytic nevi of left lower limb, including hip: Secondary | ICD-10-CM | POA: Diagnosis not present

## 2021-12-30 DIAGNOSIS — L858 Other specified epidermal thickening: Secondary | ICD-10-CM | POA: Diagnosis not present

## 2021-12-30 DIAGNOSIS — L410 Pityriasis lichenoides et varioliformis acuta: Secondary | ICD-10-CM | POA: Diagnosis not present

## 2021-12-30 DIAGNOSIS — Z85820 Personal history of malignant melanoma of skin: Secondary | ICD-10-CM | POA: Diagnosis not present

## 2022-01-12 ENCOUNTER — Ambulatory Visit
Admission: RE | Admit: 2022-01-12 | Discharge: 2022-01-12 | Disposition: A | Discharge status: Home / Self Care | Payer: BC Managed Care – PPO | Source: Ambulatory Visit | Attending: Obstetrics and Gynecology | Admitting: Obstetrics and Gynecology

## 2022-01-12 DIAGNOSIS — Z1231 Encounter for screening mammogram for malignant neoplasm of breast: Secondary | ICD-10-CM | POA: Diagnosis not present

## 2022-01-14 ENCOUNTER — Ambulatory Visit
Admission: RE | Admit: 2022-01-14 | Discharge: 2022-01-14 | Disposition: A | Discharge status: Home / Self Care | Payer: BC Managed Care – PPO | Source: Ambulatory Visit | Attending: Obstetrics and Gynecology | Admitting: Obstetrics and Gynecology

## 2022-01-14 ENCOUNTER — Other Ambulatory Visit: Payer: Self-pay | Admitting: Obstetrics and Gynecology

## 2022-01-14 DIAGNOSIS — R922 Inconclusive mammogram: Secondary | ICD-10-CM | POA: Diagnosis not present

## 2022-01-14 DIAGNOSIS — N6011 Diffuse cystic mastopathy of right breast: Secondary | ICD-10-CM | POA: Diagnosis not present

## 2022-01-14 DIAGNOSIS — R928 Other abnormal and inconclusive findings on diagnostic imaging of breast: Secondary | ICD-10-CM

## 2022-01-14 DIAGNOSIS — N6012 Diffuse cystic mastopathy of left breast: Secondary | ICD-10-CM | POA: Diagnosis not present

## 2022-01-19 NOTE — Progress Notes (Unsigned)
NEUROLOGY FOLLOW UP OFFICE NOTE  [name] [id number]  Assessment/Plan:   Migraine without aura, without status migrainosus, not intractable Right geniculate neuralgia, resolved Right upper extremity pain - mononeuropathy vs radiculopathy, resolved. Right thigh pain, unclear if lumbar radiculopathy.  Migraine prevention:  Stop Qulipta.  Start Aimovig '140mg'$  every 28 days. Migraine rescue:  She will try sample of Elyxyb.  Not to take with Advil.  Zofran and Benadryl if needed. Limit use of pain relievers to no more than 2 days out of week to prevent risk of rebound or medication-overuse headache. Keep headache diary Follow up 4 months   Subjective:  [name] is a 51 year old right-handed female with paroxysmal atrial fibrillation with RVR s/p ablation, and history of melanoma, who follows up for migraines.   UPDATE: NCV-EMG of right upper extremity on 10/14/2021 was normal.  The symptoms resolved.  Still mild discomfort only in right middle finger which may be arthritis.  Right lower extremity was deferred as symptoms resolved.     Doesn't feel the Lenoria Chime is working anymore. Regimen:  2 Advil and Benadryl - and goes to sleep for the night. Ubrelvy ineffective.   Intensity:  Moderate, not as severe Duration:  Several hours Frequency:  2 a week    Current NSAIDS/analgesics:  Tylenol, Advil Current triptans:  none Current ergotamine:  none Current anti-emetic:  Zofran ODT '8mg'$  Current muscle relaxants:  none Current Antihypertensive medications:  nadolol Current Antidepressant medications:  Venlafaxine XR '75mg'$  daily Current Anticonvulsant medications:  none Current anti-CGRP:  Qulipta '60mg'$  QD, Ubrelvy '100mg'$  Current Vitamins/Herbal/Supplements:  MVI Current Antihistamines/Decongestants:  none Other therapy:  Ice, compression Hormone/birth control:  none Other medications:  Seroquel '100mg'$  QHS PRN   Caffeine:  2 cups coffee in AM, 1 diet Coke sometimes Diet:  Poor  water intake.  Only skips meals if wakes up nauseous Exercise:  routine Depression:  controlled; Anxiety: controlled Other pain:  no Sleep:  Varies.  Uses Seroquel as needed.   HISTORY:  She has had migraines since age 86.  They are typically severe right-sided pounding headache usually preceded by blurred vision/enlarged blind spot in right eye and neck pain.  Associated nausea, vomiting, photophobia and phonophobia.  They occurred on and off until college and then they have occurred infrequently, but when they would occur, she often would require an ED visit.  About 2 to 3 years ago, she began having new headaches which have progressively become more frequent.  They are moderate pulsating pain usually in right temple but sometimes behind both eyes or back of head and neck.  No preceding aura.  Associated nausea, photophobia and phonophobia.  Often will wake up with them and will last until the next day.  Afterwards, her head feels sore.  Change in barometric pressure is a common trigger.  Bending over aggravates the headache.  She is unable to eat or exercise.  Cold compresses or other pressure helps relieve the pain.  Usually occurs a day prior to start of her menstrual cycle but on average occurs 3 to 4 days a month.     On 05/13/2017, she was seen in the ED for an intractable migraine.  MRI and MRA of brain without contrast were normal.  MRI of brain with and without contrast (performed under sedation) on 09/10/2020 showed few scattered punctate T2/FLAIR foci within the cerebral white matter  In November 2022, she developed deep severe sharp right ear pain.  No congestion or change in  hearing.  She thought that it could have been a nerve problem, possibly zoster, but she never developed a rash.  It lasted about a week and then resolved.  Around the same time, she developed right elbow pain with pain and paresthesias radiating over her forearm to the last two digits of her right hand.  Her elbow is quite  tender to touch.  She also endorses weakness with difficulty opening jars.  She has right sided neck pain that isn't new.  She reports history of possible Raynaud's involving the fifth digit.     In January 2023, she started developing pain in the anterior right thigh down to the knee.  It is a burning pain.  Thigh sore to touch.  It is not aggravated by bearing weight, only if touching her thigh.  Denies back pain, numbness, tingling, leg weakness or bowel/bladder dysfunction.  She notes that she had a melanoma removed on her right arm just above the elbow as well as above the right knee     Past NSAIDS/analgesics:  Midrin Past abortive triptans:  sumatriptan tablet (unsure if effective) Past abortive ergotamine:  none Past muscle relaxants:  none Past anti-emetic:  Zofran Past antihypertensive medications:  Propranolol  Past antidepressant medications:  Amitriptyline, fluoxetine, Wellbutrin Past anticonvulsant medications:  none Past anti-CGRP:  Nurtec Past vitamins/Herbal/Supplements:  none Past antihistamines/decongestants:  cyproheptadine Other past therapies:  none      Family history of headache:  Sister (migraines) No family history of aneurysm.    PAST MEDICAL HISTORY: Past Medical History:  Diagnosis Date   Anxiety    Atrial fibrillation ([place])    adm 01/2014   Atrial fibrillation with RVR (HCC)    Cancer (HCC)    melanoma x 2 right bicep and left thigh   Chronic constipation    Complication of anesthesia    wakes up early from anesthesia   Depression    Diabetes mellitus without complication ([place])    pt states she has not been on medications for 10 years. States her A1C is always in the normal range - per pt on 09/06/20   Headache    hx of migraines and went away and now have come back   Insomnia    OCD (obsessive compulsive disorder)    SVT (supraventricular tachycardia) (HCC)     MEDICATIONS: Current Outpatient Medications on File Prior to Visit  Medication Sig  Dispense Refill   Dexmethylphenidate HCl 30 MG CP24 Take 1 capsule by mouth every morning.     Multiple Vitamins-Minerals (MULTIVITAMIN WITH MINERALS) tablet Take 1 tablet by mouth daily.     mupirocin ointment (BACTROBAN) 2 % Apply 1 application. topically daily.     nadolol (CORGARD) 20 MG tablet TAKE 1/2 TABLET BY MOUTH DAILY (Patient not taking: Reported on 09/05/2021) 45 tablet 3   ondansetron (ZOFRAN-ODT) 8 MG disintegrating tablet TAKE 1 TABLET BY MOUTH EVERY 8 HOURS AS NEEDED FOR NAUSEA OR VOMITING. 20 tablet 5   QUEtiapine (SEROQUEL) 25 MG tablet Take 25 mg by mouth at bedtime as needed for sleep.     Ubrogepant (UBRELVY) 100 MG TABS Take 100 mg by mouth as needed (Take 1 tablet as needed. May repeat in 2 hours. Maximum 2 tablets in 24 hours.). 8 tablet 5   venlafaxine XR (EFFEXOR-XR) 75 MG 24 hr capsule Take 225 mg by mouth daily.  0   No current facility-administered medications on file prior to visit.    ALLERGIES: No Known Allergies  FAMILY  HISTORY: Family History  Problem Relation Age of Onset   COPD Mother    Heart attack Father       Objective:  Blood pressure 117/84, pulse 99, height '5\' 5"'$  (1.651 m), weight 153 lb (69.4 kg), last menstrual period 01/06/2022, SpO2 98 %. General: No acute distress.  Patient appears well-groomed.   Head:  Normocephalic/atraumatic Eyes:  Fundi examined but not visualized Neck: supple, no paraspinal tenderness, full range of motion Heart:  Regular rate and rhythm Lungs:  Clear to auscultation bilaterally Back: No paraspinal tenderness Neurological Exam: alert and oriented to person, place, and time.  Speech fluent and not dysarthric, language intact.  CN II-XII intact. Bulk and tone normal, muscle strength 5/5 throughout.  Sensation to light touch intact.  Deep tendon reflexes 2+ throughout, toes downgoing.  Finger to nose testing intact.  Gait normal, Romberg negative.   Metta Clines, DO  CC: [name], MD

## 2022-01-20 ENCOUNTER — Encounter: Payer: Self-pay | Admitting: Neurology

## 2022-01-20 ENCOUNTER — Telehealth (HOSPITAL_COMMUNITY): Payer: Self-pay | Admitting: Pharmacy Technician

## 2022-01-20 ENCOUNTER — Ambulatory Visit: Payer: BC Managed Care – PPO | Admitting: Neurology

## 2022-01-20 VITALS — BP 117/84 | HR 99 | Ht 65.0 in | Wt 153.0 lb

## 2022-01-20 DIAGNOSIS — G43009 Migraine without aura, not intractable, without status migrainosus: Secondary | ICD-10-CM

## 2022-01-20 MED ORDER — AIMOVIG 140 MG/ML ~~LOC~~ SOAJ: 140.0000 mg | SUBCUTANEOUS | 5 refills | Status: DC

## 2022-01-20 NOTE — Telephone Encounter (Signed)
Patient Advocate Encounter  Prior Authorization for Aimovig '140MG'$ /ML auto-injectors has been approved.    PA# [id number] Effective dates: 01/20/2022 through 07/23/2022      Lyndel Safe, [place] Patient Advocate Specialist [place] Patient Advocate Team Direct Number: [phone]  Fax: [phone]

## 2022-01-20 NOTE — Progress Notes (Signed)
Medication Samples have been provided to the patient.  Drug name: Elysyb       Strength: '120mg'$ /4.56m        Qty: 2  LOT:: [id number] Exp.Date: 012025  Dosing instructions: as needed  The patient has been instructed regarding the correct time, dose, and frequency of taking this medication, including desired effects and most common side effects.   SVenetia Night10:59 AM 01/20/2022

## 2022-01-20 NOTE — Patient Instructions (Addendum)
Stop Qulipta.  Start Aimovig injection every 28 days Stop Advil and Ubrelvy.  Take Elyxyb (maximum once in 24 hours).  Let me know if effective Follow up 4 months. Find out from cardiology if you can take a triptan (sumatriptan, rizatriptan, etc)

## 2022-01-20 NOTE — Telephone Encounter (Signed)
Patient Advocate Encounter   Received notification that prior authorization for Aimovig '140MG'$ /ML auto-injectors is required.   PA submitted on 01/20/2022 Key [id number] Status is pending       Lyndel Safe, [place] Patient Advocate Specialist [place] Patient Advocate Team Direct Number: [phone]  Fax: [phone]

## 2022-01-27 DIAGNOSIS — F900 Attention-deficit hyperactivity disorder, predominantly inattentive type: Secondary | ICD-10-CM | POA: Diagnosis not present

## 2022-01-27 DIAGNOSIS — F429 Obsessive-compulsive disorder, unspecified: Secondary | ICD-10-CM | POA: Diagnosis not present

## 2022-01-27 DIAGNOSIS — F411 Generalized anxiety disorder: Secondary | ICD-10-CM | POA: Diagnosis not present

## 2022-03-10 DIAGNOSIS — F429 Obsessive-compulsive disorder, unspecified: Secondary | ICD-10-CM | POA: Diagnosis not present

## 2022-03-10 DIAGNOSIS — F900 Attention-deficit hyperactivity disorder, predominantly inattentive type: Secondary | ICD-10-CM | POA: Diagnosis not present

## 2022-03-10 DIAGNOSIS — F411 Generalized anxiety disorder: Secondary | ICD-10-CM | POA: Diagnosis not present

## 2022-04-01 DIAGNOSIS — D485 Neoplasm of uncertain behavior of skin: Secondary | ICD-10-CM | POA: Diagnosis not present

## 2022-04-01 DIAGNOSIS — L988 Other specified disorders of the skin and subcutaneous tissue: Secondary | ICD-10-CM | POA: Diagnosis not present

## 2022-05-20 IMAGING — MR MR HEAD WO/W CM
14 of 16 series · 40 of 48 positions shown · IV contrast (gadavist)
Comparison: MRI/MRA head 05/13/2017.  Brain MRI 01/30/2008.

CLINICAL DATA: Migraine without Pires and without status
migrainosus, not intractable. History of melanoma. History of
worsening headaches. Additional history provided by scanning
technologist: Patient reports frequent migraines, history of
melanoma.

EXAM:
MRI HEAD WITHOUT AND WITH CONTRAST
TECHNIQUE: Multiplanar, multiecho pulse sequences of the brain and surrounding
structures were obtained without and with intravenous contrast.
CONTRAST:  6mL GADAVIST GADOBUTROL 1 MMOL/ML IV SOLN

[Series 5: DWI · axial · 3.0mm · 0.88mm/px · z∈[-145,+10]mm · 6 of 106 slices shown (1 of 4)]
[im 1/106]
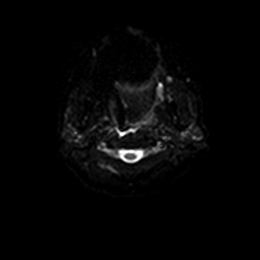
[im 22/106]
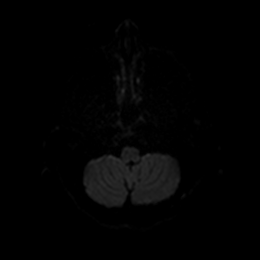
[im 43/106]
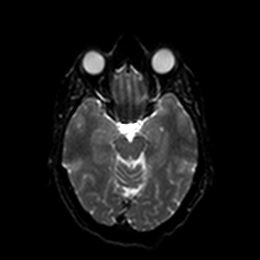
[im 64/106]
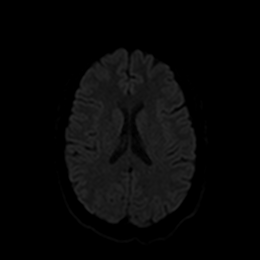
[im 85/106]
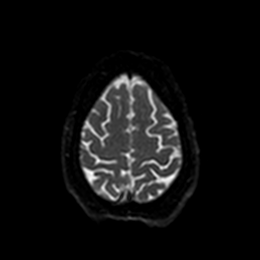
[im 106/106]
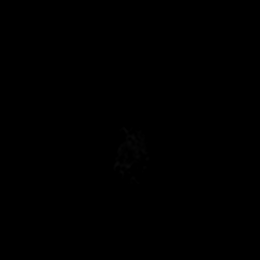

[Series 6: DWI · axial · 3.0mm · 0.88mm/px · z∈[-145,+10]mm · 2 of 53 slices shown (2 of 4)]
[im 1/53]
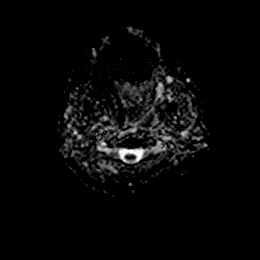
[im 53/53]
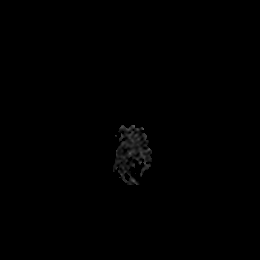

[Series 7: DWI · coronal · 4.0mm · 0.88mm/px · 3 of 64 slices shown (3 of 4)]
[im 1/64]
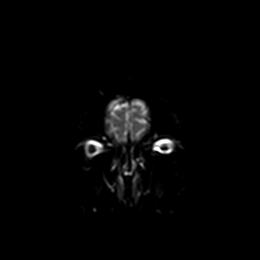
[im 32/64]
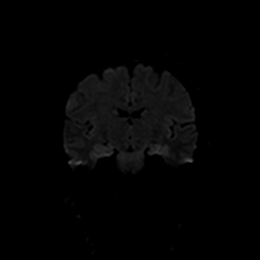
[im 64/64]
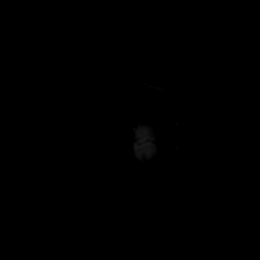

[Series 8: DWI · coronal · 4.0mm · 0.88mm/px · 2 of 31 slices shown (4 of 4)]
[im 1/31]
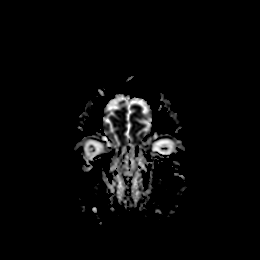
[im 31/31]
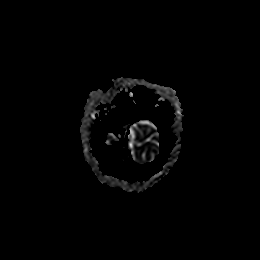

[Series 9: T1 · sagittal · 5.0mm · 0.75mm/px · 2 of 25 slices shown]
[im 1/25]
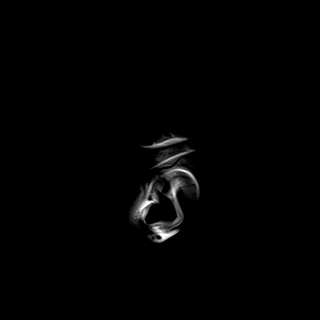
[im 25/25]
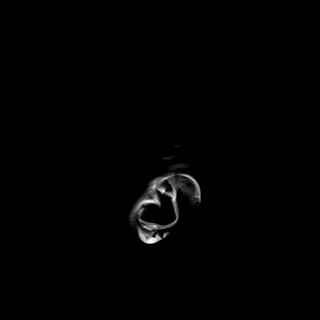

[Series 10: T2 · axial · 5.0mm · 0.72mm/px · z∈[-141,+15]mm · 2 of 27 slices shown]
[im 1/27]
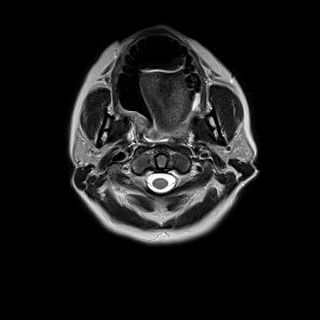
[im 27/27]
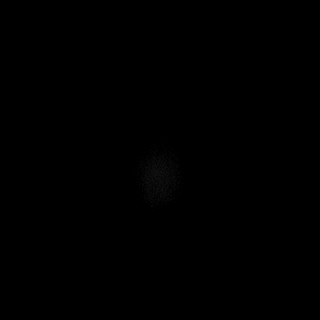

[Series 11: FLAIR · axial · 5.0mm · 0.45mm/px · z∈[-141,+15]mm · 2 of 27 slices shown]
[im 1/27]
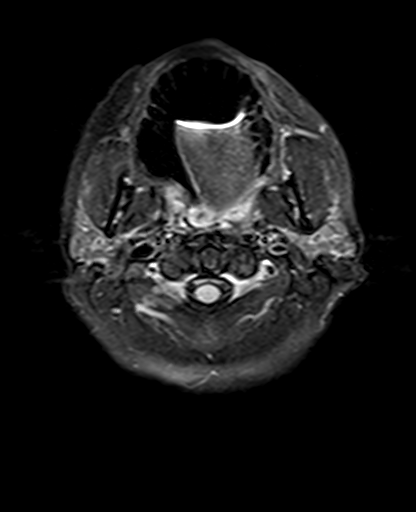
[im 27/27]
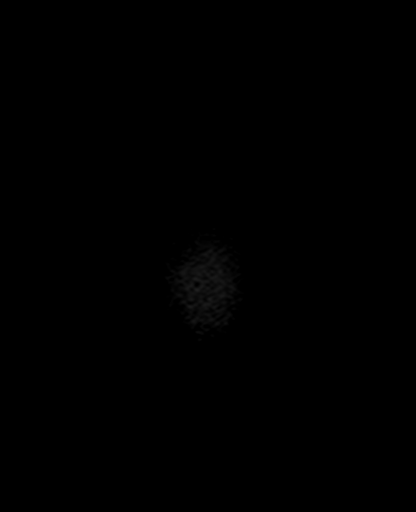

[Series 12: mag_images · axial · 3.0mm · 0.90mm/px · z∈[-151,+25]mm · 4 of 60 slices shown]
[im 1/60]
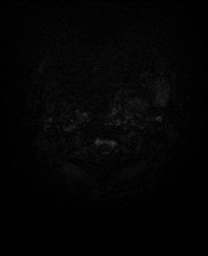
[im 20/60]
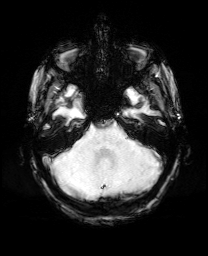
[im 40/60]
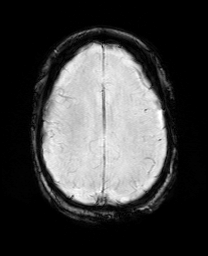
[im 60/60]
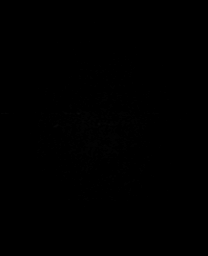

[Series 13: pha_images · axial · 3.0mm · 0.90mm/px · z∈[-151,+19]mm · 4 of 57 slices shown]
[im 1/57]
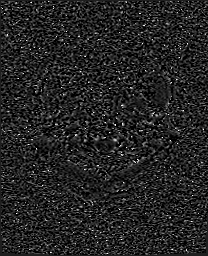
[im 19/57]
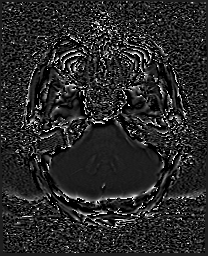
[im 38/57]
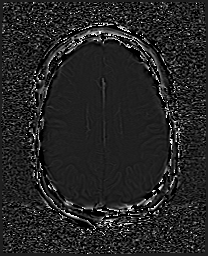
[im 57/57]
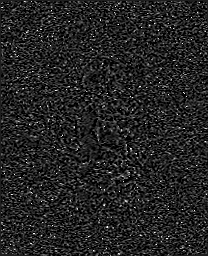

[Series 14: swi_images · axial · 3.0mm · 0.90mm/px · z∈[-151,+25]mm · 4 of 60 slices shown]
[im 1/60]
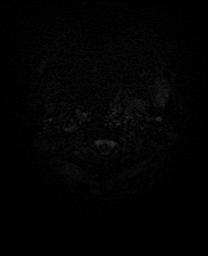
[im 20/60]
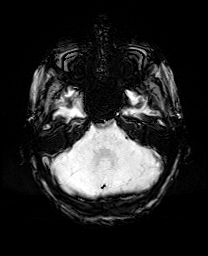
[im 40/60]
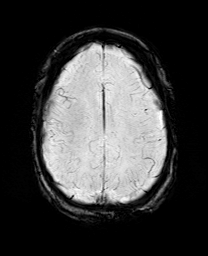
[im 60/60]
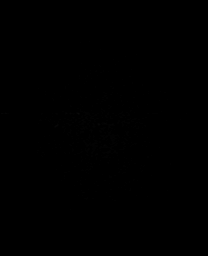

[Series 15: mip_images(sw) · axial · 24.0mm · 0.90mm/px · z∈[-141,+15]mm · 3 of 53 slices shown]
[im 1/53]
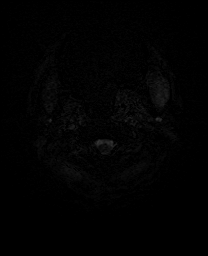
[im 27/53]
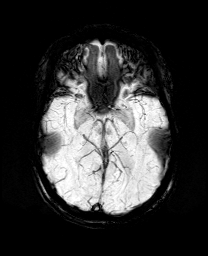
[im 53/53]
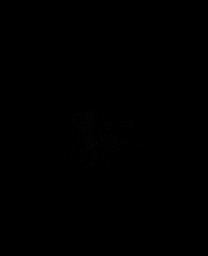

[Series 17: T2 post-contrast · coronal · 5.0mm · 0.72mm/px · 2 of 28 slices shown]
[im 1/28]
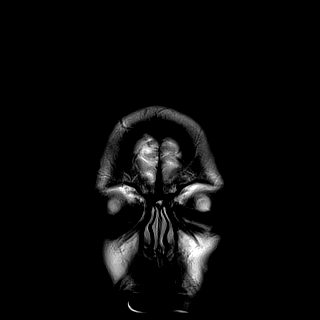
[im 28/28]
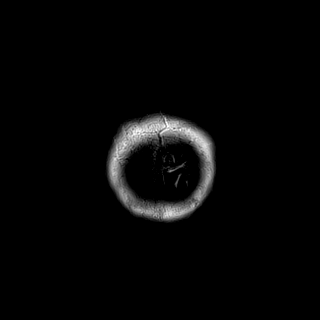

[Series 19: T1 post-contrast · coronal · 5.0mm · 0.34mm/px · 2 of 28 slices shown (1 of 2)]
[im 1/28]
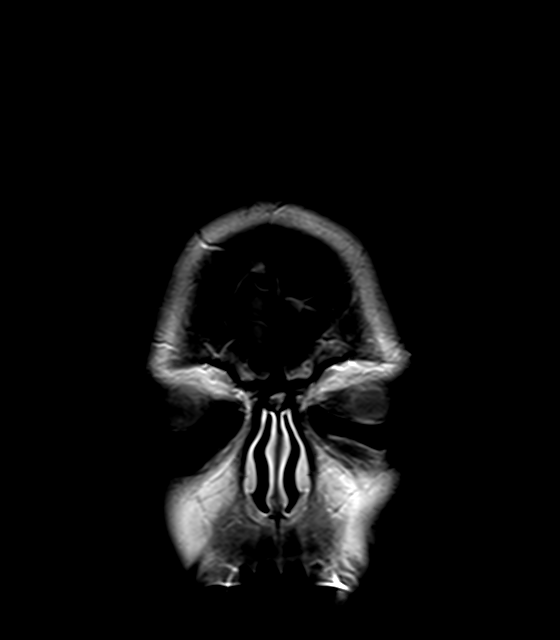
[im 28/28]
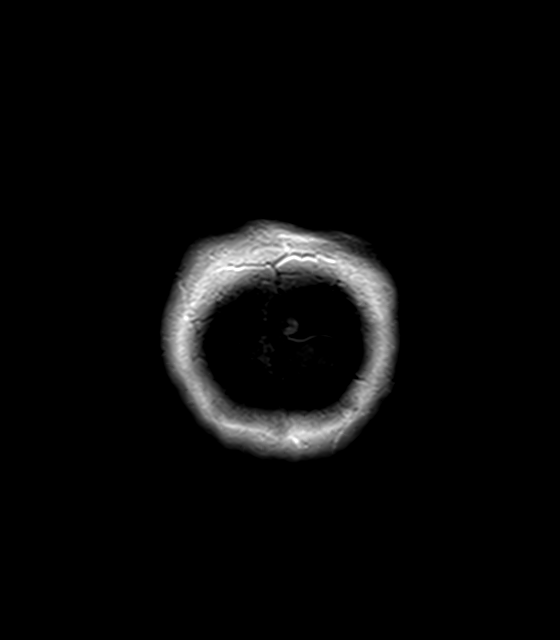

[Series 20: T1 post-contrast · sagittal · 5.0mm · 0.72mm/px · 2 of 25 slices shown (2 of 2)]
[im 1/25]
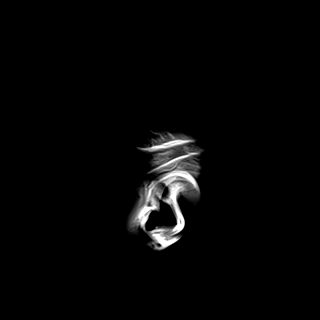
[im 25/25]
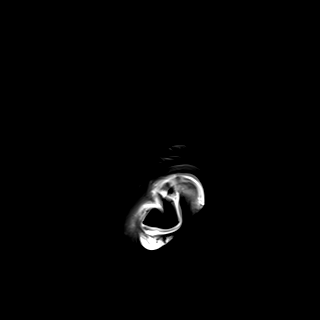

[40 of 48 positions shown; findings below may reference images not displayed]

FINDINGS: Brain:

The coronal T2 weighted sequence is moderately motion degraded.

Cerebral volume is normal.

There are a few scattered punctate foci of T2/FLAIR hyperintensity
within the bilateral cerebral white matter, nonspecific.

There is no acute infarct.

No evidence of intracranial mass.

No chronic intracranial blood products.

No extra-axial fluid collection.

No midline shift.

No abnormal intracranial enhancement.

Vascular: Expected proximal arterial flow voids.

Skull and upper cervical spine: No focal marrow lesion.

Sinuses/Orbits: Visualized orbits show no acute finding. Trace
paranasal sinus mucosal thickening, most notably ethmoidal and left
sphenoid.
IMPRESSION: No evidence of acute intracranial abnormality.

There are a few scattered punctate foci of T2/FLAIR hyperintensity
within the cerebral white matter which are new as compared to the
brain MRI of 01/30/2008. Findings are nonspecific, but most often
related to chronic small vessel ischemia. Potential alternative
considerations include migraine headaches, sequela of a prior
infectious/inflammatory process or a demyelinating process, among
others.

Otherwise unremarkable MRI appearance of the brain.

Minimal paranasal sinus mucosal thickening.

## 2022-05-26 NOTE — Progress Notes (Unsigned)
NEUROLOGY FOLLOW UP OFFICE NOTE  [name] [id number]  Assessment/Plan:   Migraine without aura, without status migrainosus, not intractable Right geniculate neuralgia, resolved Right upper extremity pain - mononeuropathy vs radiculopathy, resolved. Right thigh pain, unclear if lumbar radiculopathy.  Migraine prevention:  Stop Qulipta.  Start Aimovig '140mg'$  every 28 days. Migraine rescue:  She will try sample of Elyxyb.  Not to take with Advil.  Zofran and Benadryl if needed. Limit use of pain relievers to no more than 2 days out of week to prevent risk of rebound or medication-overuse headache. Keep headache diary Follow up 4 months   Subjective:  [name] is a 51 year old right-handed female with paroxysmal atrial fibrillation with RVR s/p ablation, and history of melanoma, who follows up for migraines.   UPDATE: Last visit, changed from [facility] to [place].  Elyxyb?????*** Regimen:  2 Advil and Benadryl - and goes to sleep for the night.  Intensity:  Moderate, not as severe Duration:  Several hours Frequency:  2 a week    Current NSAIDS/analgesics:  Tylenol, Advil Current triptans:  none Current ergotamine:  none Current anti-emetic:  Zofran ODT '8mg'$  Current muscle relaxants:  none Current Antihypertensive medications:  nadolol Current Antidepressant medications:  Venlafaxine XR '75mg'$  daily Current Anticonvulsant medications:  none Current anti-CGRP:  Aimovig '140mg'$ , Ubrelvy '100mg'$  Current Vitamins/Herbal/Supplements:  MVI Current Antihistamines/Decongestants:  none Other therapy:  Ice, compression Hormone/birth control:  none Other medications:  Seroquel '100mg'$  QHS PRN   Caffeine:  2 cups coffee in AM, 1 diet Coke sometimes Diet:  Poor water intake.  Only skips meals if wakes up nauseous Exercise:  routine Depression:  controlled; Anxiety: controlled Other pain:  no Sleep:  Varies.  Uses Seroquel as needed.   HISTORY:  She has had migraines since age 87.   They are typically severe right-sided pounding headache usually preceded by blurred vision/enlarged blind spot in right eye and neck pain.  Associated nausea, vomiting, photophobia and phonophobia.  They occurred on and off until college and then they have occurred infrequently, but when they would occur, she often would require an ED visit.  About 2 to 3 years ago, she began having new headaches which have progressively become more frequent.  They are moderate pulsating pain usually in right temple but sometimes behind both eyes or back of head and neck.  No preceding aura.  Associated nausea, photophobia and phonophobia.  Often will wake up with them and will last until the next day.  Afterwards, her head feels sore.  Change in barometric pressure is a common trigger.  Bending over aggravates the headache.  She is unable to eat or exercise.  Cold compresses or other pressure helps relieve the pain.  Usually occurs a day prior to start of her menstrual cycle but on average occurs 3 to 4 days a month.     On 05/13/2017, she was seen in the ED for an intractable migraine.  MRI and MRA of brain without contrast were normal.  MRI of brain with and without contrast (performed under sedation) on 09/10/2020 showed few scattered punctate T2/FLAIR foci within the cerebral white matter  In November 2022, she developed deep severe sharp right ear pain.  No congestion or change in hearing.  She thought that it could have been a nerve problem, possibly zoster, but she never developed a rash.  It lasted about a week and then resolved.  Around the same time, she developed right elbow pain with pain  and paresthesias radiating over her forearm to the last two digits of her right hand.  Her elbow is quite tender to touch.  She also endorses weakness with difficulty opening jars.  She has right sided neck pain that isn't new.  She reports history of possible Raynaud's involving the fifth digit.     In January 2023, she started  developing pain in the anterior right thigh down to the knee.  It is a burning pain.  Thigh sore to touch.  It is not aggravated by bearing weight, only if touching her thigh.  Denies back pain, numbness, tingling, leg weakness or bowel/bladder dysfunction.  She notes that she had a melanoma removed on her right arm just above the elbow as well as above the right knee.  NCV-EMG of right upper extremity on 10/14/2021 was normal.  The symptoms resolved.  Still mild discomfort only in right middle finger which may be arthritis.  Right lower extremity was deferred as symptoms resolved.       Past NSAIDS/analgesics:  Midrin Past abortive triptans:  sumatriptan tablet (unsure if effective) Past abortive ergotamine:  none Past muscle relaxants:  none Past anti-emetic:  Zofran Past antihypertensive medications:  Propranolol  Past antidepressant medications:  Amitriptyline, fluoxetine, Wellbutrin Past anticonvulsant medications:  none Past anti-CGRP:  Nurtec, Lajean Silvius Past vitamins/Herbal/Supplements:  none Past antihistamines/decongestants:  cyproheptadine Other past therapies:  none      Family history of headache:  Sister (migraines) No family history of aneurysm.    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 States her A1C is always in the normal range - per pt on 09/06/20   Headache    hx of migraines and went away and now have come back   Insomnia    OCD (obsessive compulsive disorder)    SVT (supraventricular tachycardia) (HCC)     MEDICATIONS: Current Outpatient Medications on File Prior to Visit   Medication Sig Dispense Refill   Dexmethylphenidate HCl 30 MG CP24 Take 1 capsule by mouth every morning.     Erenumab-aooe (AIMOVIG) 140 MG/ML SOAJ Inject 140 mg into the skin every 28 (twenty-eight) days. 1.12 mL 5   Multiple Vitamins-Minerals (MULTIVITAMIN WITH MINERALS) tablet Take 1 tablet by mouth daily.     mupirocin ointment (BACTROBAN) 2 % Apply 1 application. topically daily. (Patient not taking: Reported on 01/20/2022)     nadolol (CORGARD) 20 MG tablet TAKE 1/2 TABLET BY MOUTH DAILY (Patient not taking: Reported on 09/05/2021) 45 tablet 3   ondansetron (ZOFRAN-ODT) 8 MG disintegrating tablet TAKE 1 TABLET BY MOUTH EVERY 8 HOURS AS NEEDED FOR NAUSEA OR VOMITING. 20 tablet 5   QUEtiapine (SEROQUEL) 25 MG tablet Take 25 mg by mouth at bedtime as needed for sleep.     venlafaxine XR (EFFEXOR-XR) 75 MG 24 hr capsule Take 225 mg by mouth daily.  0   No current facility-administered medications on file prior to visit.    ALLERGIES: No Known Allergies  FAMILY HISTORY: Family History  Problem Relation Age of  Onset   COPD Mother    Heart attack Father       Objective:  *** General: No acute distress.  Patient appears well-groomed.   Head:  Normocephalic/atraumatic Eyes:  Fundi examined but not visualized Neck: supple, no paraspinal tenderness, full range of motion Heart:  Regular rate and rhythm Neurological Exam: ***   Metta Clines, DO  CC: [name], MD

## 2022-05-28 ENCOUNTER — Encounter: Payer: Self-pay | Admitting: Neurology

## 2022-05-28 ENCOUNTER — Ambulatory Visit: Payer: BC Managed Care – PPO | Admitting: Neurology

## 2022-05-28 VITALS — BP 113/74 | HR 103 | Ht 65.0 in | Wt 155.2 lb

## 2022-05-28 DIAGNOSIS — G43009 Migraine without aura, not intractable, without status migrainosus: Secondary | ICD-10-CM

## 2022-05-28 MED ORDER — ONDANSETRON 8 MG PO TBDP: 8.0000 mg | ORAL_TABLET | Freq: Three times a day (TID) | ORAL | 5 refills | Status: DC | PRN

## 2022-06-24 ENCOUNTER — Telehealth: Payer: Self-pay

## 2022-06-24 NOTE — Telephone Encounter (Signed)
Patient Advocate Encounter   Received notification from [employer] that prior authorization for Aimovig '140MG'$ /ML auto-injectors is due for renewal.   PA submitted on 06-24-2022 Key [id number]  Renewal is APPROVED

## 2022-07-13 DIAGNOSIS — D225 Melanocytic nevi of trunk: Secondary | ICD-10-CM | POA: Diagnosis not present

## 2022-07-13 DIAGNOSIS — D485 Neoplasm of uncertain behavior of skin: Secondary | ICD-10-CM | POA: Diagnosis not present

## 2022-07-13 DIAGNOSIS — D2262 Melanocytic nevi of left upper limb, including shoulder: Secondary | ICD-10-CM | POA: Diagnosis not present

## 2022-07-13 DIAGNOSIS — D2239 Melanocytic nevi of other parts of face: Secondary | ICD-10-CM | POA: Diagnosis not present

## 2022-07-13 DIAGNOSIS — Z85820 Personal history of malignant melanoma of skin: Secondary | ICD-10-CM | POA: Diagnosis not present

## 2022-07-13 DIAGNOSIS — L821 Other seborrheic keratosis: Secondary | ICD-10-CM | POA: Diagnosis not present

## 2022-07-13 DIAGNOSIS — D2272 Melanocytic nevi of left lower limb, including hip: Secondary | ICD-10-CM | POA: Diagnosis not present

## 2022-07-15 DIAGNOSIS — F411 Generalized anxiety disorder: Secondary | ICD-10-CM | POA: Diagnosis not present

## 2022-07-15 DIAGNOSIS — F429 Obsessive-compulsive disorder, unspecified: Secondary | ICD-10-CM | POA: Diagnosis not present

## 2022-07-15 DIAGNOSIS — F900 Attention-deficit hyperactivity disorder, predominantly inattentive type: Secondary | ICD-10-CM | POA: Diagnosis not present

## 2022-07-20 ENCOUNTER — Other Ambulatory Visit: Payer: Self-pay | Admitting: Neurology

## 2022-11-25 NOTE — Progress Notes (Deleted)
NEUROLOGY FOLLOW UP OFFICE NOTE  [name] [id number]  Assessment/Plan:   Migraine without aura, without status migrainosus, not intractable - stable Right geniculate neuralgia, resolved Right upper extremity pain - mononeuropathy vs radiculopathy, resolved. Right thigh pain, unclear if lumbar radiculopathy -resolved   Migraine prevention:  Aimovig 140mg  every 28 days. *** Migraine rescue:  She will try sample of Elyxyb.  Not to take with Advil.  Zofran and Benadryl if needed. *** Limit use of pain relievers to no more than 2 days out of week to prevent risk of rebound or medication-overuse headache. Keep headache diary Follow up 6 months     Subjective:  [name] is a 52 year old right-handed female with paroxysmal atrial fibrillation with RVR s/p ablation, and history of melanoma, who follows up for migraines.   UPDATE: Regimen:  2 Advil and Benadryl - and goes to sleep for the night. *** Elyxyb *** Intensity:  N/A Duration:  N/A Frequency:  No migraines in last 30 days.  May get nausea once in awhile     Current NSAIDS/analgesics:  Tylenol, Advil Current triptans:  none Current ergotamine:  none Current anti-emetic:  Zofran ODT 8mg  Current muscle relaxants:  none Current Antihypertensive medications:  nadolol Current Antidepressant medications:  Venlafaxine XR 75mg  daily Current Anticonvulsant medications:  none Current anti-CGRP:  Aimovig 140mg , Ubrelvy 100mg  Current Vitamins/Herbal/Supplements:  MVI Current Antihistamines/Decongestants:  none Other therapy:  Ice, compression Hormone/birth control:  none Other medications:  Seroquel 100mg  QHS PRN   Caffeine:  2 cups coffee in AM, 1 diet Coke sometimes Diet:  Poor water intake.  Only skips meals if wakes up nauseous Exercise:  routine Depression:  controlled; Anxiety: controlled Other pain:  no Sleep:  Varies.  Uses Seroquel as needed.   HISTORY:  She has had migraines since age 27.  They are typically  severe right-sided pounding headache usually preceded by blurred vision/enlarged blind spot in right eye and neck pain.  Associated nausea, vomiting, photophobia and phonophobia.  They occurred on and off until college and then they have occurred infrequently, but when they would occur, she often would require an ED visit.  About 2 to 3 years ago, she began having new headaches which have progressively become more frequent.  They are moderate pulsating pain usually in right temple but sometimes behind both eyes or back of head and neck.  No preceding aura.  Associated nausea, photophobia and phonophobia.  Often will wake up with them and will last until the next day.  Afterwards, her head feels sore.  Change in barometric pressure is a common trigger.  Bending over aggravates the headache.  She is unable to eat or exercise.  Cold compresses or other pressure helps relieve the pain.  Usually occurs a day prior to start of her menstrual cycle but on average occurs 3 to 4 days a month.     On 05/13/2017, she was seen in the ED for an intractable migraine.  MRI and MRA of brain without contrast were normal.  MRI of brain with and without contrast (performed under sedation) on 09/10/2020 showed few scattered punctate T2/FLAIR foci within the cerebral white matter   In November 2022, she developed deep severe sharp right ear pain.  No congestion or change in hearing.  She thought that it could have been a nerve problem, possibly zoster, but she never developed a rash.  It lasted about a week and then resolved.  Around the same time, she developed  right elbow pain with pain and paresthesias radiating over her forearm to the last two digits of her right hand.  Her elbow is quite tender to touch.  She also endorses weakness with difficulty opening jars.  She has right sided neck pain that isn't new.  She reports history of possible Raynaud's involving the fifth digit.     In January 2023, she started developing pain in  the anterior right thigh down to the knee.  It is a burning pain.  Thigh sore to touch.  It is not aggravated by bearing weight, only if touching her thigh.  Denies back pain, numbness, tingling, leg weakness or bowel/bladder dysfunction.  She notes that she had a melanoma removed on her right arm just above the elbow as well as above the right knee.  NCV-EMG of right upper extremity on 10/14/2021 was normal.  The symptoms resolved.  Still mild discomfort only in right middle finger which may be arthritis.  Right lower extremity was deferred as symptoms resolved.       Past NSAIDS/analgesics:  Midrin Past abortive triptans:  sumatriptan tablet (unsure if effective) Past abortive ergotamine:  none Past muscle relaxants:  none Past anti-emetic:  Zofran Past antihypertensive medications:  Propranolol  Past antidepressant medications:  Amitriptyline, fluoxetine, Wellbutrin Past anticonvulsant medications:  none Past anti-CGRP:  Nurtec, Lajean Silvius Past vitamins/Herbal/Supplements:  none Past antihistamines/decongestants:  cyproheptadine Other past therapies:  none      Family history of headache:  Sister (migraines) No family history of aneurysm.    PAST MEDICAL HISTORY: Past Medical History:  Diagnosis Date   Anxiety    Atrial fibrillation ([place])    adm 01/2014   Atrial fibrillation with RVR ([facility])    Cancer ([facility])    melanoma x 2 right bicep and left thigh   Chronic constipation    Complication of anesthesia    wakes up early from anesthesia   Depression    Diabetes mellitus without complication ([place])    pt states she has not been on medications for 10 years. States her A1C is always in the normal range - per pt on 09/06/20   Headache    hx of migraines and went away and now have come back   Insomnia    OCD (obsessive compulsive disorder)    SVT (supraventricular tachycardia)     MEDICATIONS: Current Outpatient Medications on File Prior to Visit  Medication Sig Dispense  Refill   Dexmethylphenidate HCl 30 MG CP24 Take 1 capsule by mouth every morning.     Erenumab-aooe (AIMOVIG) 140 MG/ML SOAJ INJECT 140 MG INTO THE SKIN EVERY 28 DAYS. 1 mL 5   Multiple Vitamins-Minerals (MULTIVITAMIN WITH MINERALS) tablet Take 1 tablet by mouth daily.     mupirocin ointment (BACTROBAN) 2 % Apply 1 application  topically daily.     nadolol (CORGARD) 20 MG tablet TAKE 1/2 TABLET BY MOUTH DAILY 45 tablet 3   ondansetron (ZOFRAN-ODT) 8 MG disintegrating tablet Take 1 tablet (8 mg total) by mouth every 8 (eight) hours as needed. 20 tablet 5   QUEtiapine (SEROQUEL) 25 MG tablet Take 25 mg by mouth at bedtime as needed for sleep.     venlafaxine XR (EFFEXOR-XR) 75 MG 24 hr capsule Take 225 mg by mouth daily.  0   No current facility-administered medications on file prior to visit.    ALLERGIES: No Known Allergies  FAMILY HISTORY: Family History  Problem Relation Age of Onset   COPD Mother  Heart attack Father       Objective:  *** General: No acute distress.  Patient appears well-groomed.   Head:  Normocephalic/atraumatic Eyes:  Fundi examined but not visualized Neck: supple, no paraspinal tenderness, full range of motion Heart:  Regular rate and rhythm Neurological Exam: alert and oriented to person, place, and time.  Speech fluent and not dysarthric, language intact.  CN II-XII intact. Bulk and tone normal, muscle strength 5/5 throughout.  Sensation to light touch intact.  Deep tendon reflexes 2+ throughout.  Finger to nose testing intact.  Gait normal, Romberg negative.   Metta Clines, DO  CC: ***

## 2022-11-26 ENCOUNTER — Ambulatory Visit: Payer: BC Managed Care – PPO | Admitting: Neurology

## 2022-12-23 ENCOUNTER — Encounter: Payer: Self-pay | Admitting: Neurology

## 2022-12-23 ENCOUNTER — Ambulatory Visit: Payer: BC Managed Care – PPO | Admitting: Neurology

## 2022-12-23 VITALS — BP 102/77 | HR 96 | Ht 64.0 in | Wt 136.6 lb

## 2022-12-23 DIAGNOSIS — G43009 Migraine without aura, not intractable, without status migrainosus: Secondary | ICD-10-CM | POA: Diagnosis not present

## 2022-12-23 DIAGNOSIS — G250 Essential tremor: Secondary | ICD-10-CM

## 2022-12-23 MED ORDER — RIZATRIPTAN BENZOATE 10 MG PO TBDP: 10.0000 mg | ORAL_TABLET | ORAL | 5 refills | Status: DC | PRN

## 2022-12-23 MED ORDER — ONDANSETRON 8 MG PO TBDP: 8.0000 mg | ORAL_TABLET | Freq: Three times a day (TID) | ORAL | 5 refills | Status: DC | PRN

## 2022-12-23 MED ORDER — PROPRANOLOL HCL ER 60 MG PO CP24: 60.0000 mg | ORAL_CAPSULE | Freq: Every day | ORAL | 5 refills | Status: DC

## 2022-12-23 MED ORDER — AIMOVIG 140 MG/ML ~~LOC~~ SOAJ: SUBCUTANEOUS | 11 refills | Status: DC

## 2022-12-23 NOTE — Patient Instructions (Signed)
Start propranolol ER 60mg  daily to treat tremor.  Let me know if you start becoming lightheaded.  Continue Aimovig every 28 days Earliest onset of migraine, take rizatriptan MLT 10mg  and ondansetron ODT 4mg   Follow up 6 months.

## 2022-12-23 NOTE — Progress Notes (Signed)
NEUROLOGY FOLLOW UP OFFICE NOTE  [name] [id number]  Assessment/Plan:   Migraine without aura, without status migrainosus, not intractable - stable Essential tremor   Migraine prevention:  Aimovig 140mg  every 28 days.  Migraine rescue:  She will try Maxalt MLT 10mg  (due to severe nausea, needs disintegrative tablet), Zofran ODT 8mg  For tremor, start propranolol ER 60mg  daily.  She will monitor for lightheadedness.  Not to take with nadolol. Limit use of pain relievers to no more than 2 days out of week to prevent risk of rebound or medication-overuse headache. Keep headache diary Follow up 6 months     Subjective:  [name] is a 52 year old right-handed female with paroxysmal atrial fibrillation with RVR s/p ablation, and history of melanoma, who follows up for migraines.   UPDATE: Regimen:  2 Advil and Benadryl - and goes to sleep for the night.  Elyxyb ineffective.  Her cardiologist, Dr. Anne Fu, has no objection to starting a triptan as she is well treated/stable. Intensity:  moderate Duration:  treats with her rescue protocol and goes to sleep.  Wakes up improved but not yet resolved. Frequency:  May get 2 headaches the last week prior to next injection.  Otherwise, may have 1 another week.    Essential tremor runs in her family.  Both her mom and sister have it.  It comes and goes, often more noticeable later in day or evening.  Her hand will shake with use.  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      Current NSAIDS/analgesics:  Tylenol, Advil Current triptans:  none Current ergotamine:  none Current anti-emetic:  Zofran ODT 8mg  Current muscle relaxants:  none Current Antihypertensive medications:  nadolol Current Antidepressant medications:  Venlafaxine XR 75mg  daily Current Anticonvulsant medications:  none Current anti-CGRP:  Aimovig 140mg , Ubrelvy 100mg  Current Vitamins/Herbal/Supplements:  MVI Current  Antihistamines/Decongestants:  none Other therapy:  Ice, compression Hormone/birth control:  none Other medications:  Seroquel 100mg  QHS PRN   Caffeine:  2 cups coffee in AM, 1 diet Coke sometimes Diet:  Poor water intake.  Only skips meals if wakes up nauseous Exercise:  routine Depression:  controlled; Anxiety: controlled Other pain:  no Sleep:  Varies.  Uses Seroquel as needed.   HISTORY:  She has had migraines since age 3.  They are typically severe right-sided pounding headache usually preceded by blurred vision/enlarged blind spot in right eye and neck pain.  Associated nausea, vomiting, photophobia and phonophobia.  They occurred on and off until college and then they have occurred infrequently, but when they would occur, she often would require an ED visit.  About 2 to 3 years ago, she began having new headaches which have progressively become more frequent.  They are moderate pulsating pain usually in right temple but sometimes behind both eyes or back of head and neck.  No preceding aura.  Associated nausea, photophobia and phonophobia.  Often will wake up with them and will last until the next day.  Afterwards, her head feels sore.  Change in barometric pressure is a common trigger.  Bending over aggravates the headache.  She is unable to eat or exercise.  Cold compresses or other pressure helps relieve the pain.  Usually occurs a day prior to start of her menstrual cycle but on average occurs 3 to 4 days a month.     On 05/13/2017, she was seen in the ED for an intractable migraine.  MRI and MRA of brain without contrast were normal.  MRI of brain with and without contrast (performed under sedation) on 09/10/2020 showed few scattered punctate T2/FLAIR foci within the cerebral white matter   In November 2022, she developed deep severe sharp right ear pain.  No congestion or change in hearing.  She thought that it could have been a nerve problem, possibly zoster, but she never developed a  rash.  It lasted about a week and then resolved.  Around the same time, she developed right elbow pain with pain and paresthesias radiating over her forearm to the last two digits of her right hand.  Her elbow is quite tender to touch.  She also endorses weakness with difficulty opening jars.  She has right sided neck pain that isn't new.  She reports history of possible Raynaud's involving the fifth digit.     In January 2023, she started developing pain in the anterior right thigh down to the knee.  It is a burning pain.  Thigh sore to touch.  It is not aggravated by bearing weight, only if touching her thigh.  Denies back pain, numbness, tingling, leg weakness or bowel/bladder dysfunction.  She notes that she had a melanoma removed on her right arm just above the elbow as well as above the right knee.  NCV-EMG of right upper extremity on 10/14/2021 was normal.  The symptoms resolved.  Still mild discomfort only in right middle finger which may be arthritis.  Right lower extremity was deferred as symptoms resolved.       Past NSAIDS/analgesics:  Midrin Past abortive triptans:  sumatriptan tablet (unsure if effective) Past abortive ergotamine:  none Past muscle relaxants:  none Past anti-emetic:  Zofran Past antihypertensive medications:  Propranolol  Past antidepressant medications:  Amitriptyline, fluoxetine, Wellbutrin Past anticonvulsant medications:  none Past anti-CGRP:  Nurtec, Nicolasa Ducking Past vitamins/Herbal/Supplements:  none Past antihistamines/decongestants:  cyproheptadine Other past therapies:  none      Family history of headache:  Sister (migraines) No family history of aneurysm.    PAST MEDICAL HISTORY: Past Medical History:  Diagnosis Date   Anxiety    Atrial fibrillation ([facility])    adm 01/2014   Atrial fibrillation with RVR ([facility])    Cancer ([facility])    melanoma x 2 right bicep and left thigh   Chronic constipation    Complication of anesthesia    wakes up early from  anesthesia   Depression    Diabetes mellitus without complication ([facility])    pt states she has not been on medications for 10 years. States her A1C is always in the normal range - per pt on 09/06/20   Headache    hx of migraines and went away and now have come back   Insomnia    OCD (obsessive compulsive disorder)    SVT (supraventricular tachycardia)     MEDICATIONS: Current Outpatient Medications on File Prior to Visit  Medication Sig Dispense Refill   Dexmethylphenidate HCl 30 MG CP24 Take 1 capsule by mouth every morning.     Erenumab-aooe (AIMOVIG) 140 MG/ML SOAJ INJECT 140 MG INTO THE SKIN EVERY 28 DAYS. 1 mL 5   Multiple Vitamins-Minerals (MULTIVITAMIN WITH MINERALS) tablet Take 1 tablet by mouth daily.     mupirocin ointment (BACTROBAN) 2 % Apply 1 application  topically daily.     nadolol (CORGARD) 20 MG tablet TAKE 1/2 TABLET BY MOUTH DAILY 45 tablet 3   ondansetron (ZOFRAN-ODT) 8 MG disintegrating tablet Take 1 tablet (  8 mg total) by mouth every 8 (eight) hours as needed. 20 tablet 5   QUEtiapine (SEROQUEL) 25 MG tablet Take 25 mg by mouth at bedtime as needed for sleep.     venlafaxine XR (EFFEXOR-XR) 75 MG 24 hr capsule Take 225 mg by mouth daily.  0   No current facility-administered medications on file prior to visit.    ALLERGIES: No Known Allergies  FAMILY HISTORY: Family History  Problem Relation Age of Onset   COPD Mother    Heart attack Father       Objective:  Blood pressure 102/77, pulse 96, height 5\' 4"  (1.626 m), weight 136 lb 9.6 oz (62 kg), SpO2 99 %. General: No acute distress.  Patient appears well-groomed.   Head:  Normocephalic/atraumatic Eyes:  Fundi examined but not visualized Neck: supple, no paraspinal tenderness, full range of motion Heart:  Regular rate and rhythm Neurological Exam: alert and oriented to person, place, and time.  Speech fluent and not dysarthric, language intact.  CN II-XII intact. Bulk and tone normal, muscle strength 5/5  throughout.  No tremor noted.  Sensation to light touch intact.  Deep tendon reflexes 2+ throughout.  Finger to nose testing intact.  Gait normal, Romberg negative.   Shon Millet, DO  CC: Olivia Mackie, MD

## 2023-01-11 DIAGNOSIS — D2272 Melanocytic nevi of left lower limb, including hip: Secondary | ICD-10-CM | POA: Diagnosis not present

## 2023-01-11 DIAGNOSIS — L821 Other seborrheic keratosis: Secondary | ICD-10-CM | POA: Diagnosis not present

## 2023-01-11 DIAGNOSIS — Z85820 Personal history of malignant melanoma of skin: Secondary | ICD-10-CM | POA: Diagnosis not present

## 2023-01-11 DIAGNOSIS — D225 Melanocytic nevi of trunk: Secondary | ICD-10-CM | POA: Diagnosis not present

## 2023-01-20 ENCOUNTER — Other Ambulatory Visit: Payer: Self-pay | Admitting: Obstetrics and Gynecology

## 2023-01-20 DIAGNOSIS — Z1231 Encounter for screening mammogram for malignant neoplasm of breast: Secondary | ICD-10-CM

## 2023-01-22 ENCOUNTER — Ambulatory Visit
Admission: RE | Admit: 2023-01-22 | Discharge: 2023-01-22 | Disposition: A | Discharge status: Home / Self Care | Payer: BC Managed Care – PPO | Source: Ambulatory Visit | Attending: Obstetrics and Gynecology | Admitting: Obstetrics and Gynecology

## 2023-01-22 DIAGNOSIS — Z1231 Encounter for screening mammogram for malignant neoplasm of breast: Secondary | ICD-10-CM

## 2023-02-08 ENCOUNTER — Ambulatory Visit: Payer: BC Managed Care – PPO | Admitting: Neurology

## 2023-04-16 ENCOUNTER — Other Ambulatory Visit: Payer: Self-pay | Admitting: Medical Genetics

## 2023-04-16 DIAGNOSIS — Z006 Encounter for examination for normal comparison and control in clinical research program: Secondary | ICD-10-CM

## 2023-04-29 DIAGNOSIS — R5383 Other fatigue: Secondary | ICD-10-CM | POA: Diagnosis not present

## 2023-04-29 DIAGNOSIS — G4700 Insomnia, unspecified: Secondary | ICD-10-CM | POA: Diagnosis not present

## 2023-04-29 DIAGNOSIS — Z01419 Encounter for gynecological examination (general) (routine) without abnormal findings: Secondary | ICD-10-CM | POA: Diagnosis not present

## 2023-04-29 DIAGNOSIS — F5104 Psychophysiologic insomnia: Secondary | ICD-10-CM | POA: Diagnosis not present

## 2023-04-29 DIAGNOSIS — Z124 Encounter for screening for malignant neoplasm of cervix: Secondary | ICD-10-CM | POA: Diagnosis not present

## 2023-06-04 ENCOUNTER — Other Ambulatory Visit (HOSPITAL_COMMUNITY): Payer: Self-pay

## 2023-06-04 ENCOUNTER — Telehealth: Payer: Self-pay | Admitting: Pharmacy Technician

## 2023-06-04 NOTE — Telephone Encounter (Signed)
Pharmacy Patient Advocate Encounter   Received notification from CoverMyMeds that prior authorization for AIMOVIG 140MG  is required/requested.   Insurance verification completed.   The patient is insured through [facility] .   Per test claim: PA required; PA submitted to [facility] via CoverMyMeds Key/confirmation #/EOC [facility] Status is pending

## 2023-06-04 NOTE — Telephone Encounter (Signed)
Pharmacy Patient Advocate Encounter  Received notification from [facility] that Prior Authorization for AIMOVIG 140MG  has been APPROVED from 10.11.24 to 10.11.25   PA #/Case ID/Reference #: [id number]

## 2023-06-23 NOTE — Progress Notes (Signed)
NEUROLOGY FOLLOW UP OFFICE NOTE  [name] [id number]  Assessment/Plan:   Migraine without aura, without status migrainosus, not intractable Essential tremor Glossopharyngeal neuralgia   Migraine prevention:  Aimovig 140mg  every 28 days.  Migraine rescue:  Stop rizatriptan.  Start sumatriptan 4mg  [place], Zofran ODT 8mg  For tremor: propranolol ER 60mg  daily.  She will monitor for lightheadedness.  Not to take with nadolol.  Would not increase dose further as she has low blood pressure. Regarding glossopharyngeal neuralgia, will prescribe gabapentin 100mg  to take as needed during flare up.  We also discussed repeating MRI but she defers at this time.  She will contact me if she changes her mind.   Limit use of pain relievers to no more than 2 days out of week to prevent risk of rebound or medication-overuse headache. Keep headache diary Follow up 5 months     Subjective:  [name] is a 52 year old right-handed female with paroxysmal atrial fibrillation with RVR s/p ablation, and history of melanoma, who follows up for migraines.   UPDATE: Migraine frequency fairly stable but acute treatments not effective (rizatriptan, Advil, Benadryl), especially since she primarily wakes up with the migraine. Intensity:  moderate Duration:  several hours. Frequency:  4-5 a month   Essential tremor is stable on propranolol  Since late 2022, she has had recurrent episodes of severe sharp deep pain in her left ear and radiating down to the back of her through and posterior her tongue.  The back of her throat and posterior tongue are numb.  This lasts all day and may occur approximately every 4 months.  She thought it may be shingles as she has a recurrent history, but never develops a rash.    Current NSAIDS/analgesics:  Tylenol, Advil Current triptans:  rizatriptan-MLT 10mg  Current ergotamine:  none Current anti-emetic:  Zofran ODT 8mg  Current muscle relaxants:  none Current Antihypertensive  medications:  propranolol ER 60mg  daily, nadolol PRN (rarely takes) Current Antidepressant medications:  Venlafaxine XR 75mg  daily Current Anticonvulsant medications:  none Current anti-CGRP:  Aimovig 140mg  Current Vitamins/Herbal/Supplements:  MVI Current Antihistamines/Decongestants:  none Other therapy:  Ice, compression Hormone/birth control:  none Other medications:  Dexmethylphenidate, Seroquel 100mg  QHS PRN   Caffeine:  2 cups coffee in AM, 1 diet Coke sometimes Diet:  Poor water intake.  Only skips meals if wakes up nauseous Exercise:  routine Depression:  controlled; Anxiety: controlled Other pain:  no Sleep:  Varies.  Uses Seroquel as needed.   HISTORY:  Migraines: She has had migraines since age 88.  They are typically severe right-sided pounding headache usually preceded by blurred vision/enlarged blind spot in right eye and neck pain.  Associated nausea, vomiting, photophobia and phonophobia.  They occurred on and off until college and then they have occurred infrequently, but when they would occur, she often would require an ED visit.  About 2 to 3 years ago, she began having new headaches which have progressively become more frequent.  They are moderate pulsating pain usually in right temple but sometimes behind both eyes or back of head and neck.  No preceding aura.  Associated nausea, photophobia and phonophobia.  Often will wake up with them and will last until the next day.  Afterwards, her head feels sore.  Change in barometric pressure is a common trigger.  Bending over aggravates the headache.  She is unable to eat or exercise.  Cold compresses or other pressure helps relieve the pain.  Usually occurs a day  prior to start of her menstrual cycle but on average occurs 3 to 4 days a month.     On 05/13/2017, she was seen in the ED for an intractable migraine.  MRI and MRA of brain without contrast were normal.  MRI of brain with and without contrast (performed under sedation) on  09/10/2020 showed few scattered punctate T2/FLAIR foci within the cerebral white matter  Essential tremor: Essential tremor runs in her family.  Both her mom and sister have it.  It comes and goes, often more noticeable later in day or evening.  Her hand will shake with use.  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     In November 2022, she developed deep severe sharp right ear pain.  No congestion or change in hearing.  She thought that it could have been a nerve problem, possibly zoster, but she never developed a rash.  It lasted about a week and then resolved.  Around the same time, she developed right elbow pain with pain and paresthesias radiating over her forearm to the last two digits of her right hand.  Her elbow is quite tender to touch.  She also endorses weakness with difficulty opening jars.  She has right sided neck pain that isn't new.  She reports history of possible Raynaud's involving the fifth digit.     In January 2023, she started developing pain in the anterior right thigh down to the knee.  It is a burning pain.  Thigh sore to touch.  It is not aggravated by bearing weight, only if touching her thigh.  Denies back pain, numbness, tingling, leg weakness or bowel/bladder dysfunction.  She notes that she had a melanoma removed on her right arm just above the elbow as well as above the right knee.  NCV-EMG of right upper extremity on 10/14/2021 was normal.  The symptoms resolved.  Still mild discomfort only in right middle finger which may be arthritis.  Right lower extremity was deferred as symptoms resolved.       Past NSAIDS/analgesics:  Midrin, Elyxyb Past abortive triptans:  sumatriptan tablet Past abortive ergotamine:  none Past muscle relaxants:  none Past anti-emetic:  Zofran Past antihypertensive medications:  Propranolol  Past antidepressant medications:  Amitriptyline, fluoxetine, Wellbutrin Past anticonvulsant medications:   none Past anti-CGRP:  Nurtec, Nicolasa Ducking Past vitamins/Herbal/Supplements:  none Past antihistamines/decongestants:  cyproheptadine Other past therapies:  none      Family history of headache:  Sister (migraines) No family history of aneurysm.    PAST MEDICAL HISTORY: Past Medical History:  Diagnosis Date   Anxiety    Atrial fibrillation (HCC)    adm 01/2014   Atrial fibrillation with RVR (HCC)    Cancer (HCC)    melanoma x 2 right bicep and left thigh   Chronic constipation    Complication of anesthesia    wakes up early from anesthesia   Depression    Diabetes mellitus without complication (HCC)    pt states she has not been on medications for 10 years. States her A1C is always in the normal range - per pt on 09/06/20   Headache    hx of migraines and went away and now have come back   Insomnia    OCD (obsessive compulsive disorder)    SVT (supraventricular tachycardia)     MEDICATIONS: Current Outpatient Medications on File Prior to Visit  Medication Sig Dispense Refill   Dexmethylphenidate HCl 30 MG  CP24 Take 1 capsule by mouth every morning.     Erenumab-aooe (AIMOVIG) 140 MG/ML SOAJ INJECT 140 MG INTO THE SKIN EVERY 28 DAYS. 1 mL 11   Multiple Vitamins-Minerals (MULTIVITAMIN WITH MINERALS) tablet Take 1 tablet by mouth daily.     mupirocin ointment (BACTROBAN) 2 % Apply 1 application  topically daily.     nadolol (CORGARD) 20 MG tablet TAKE 1/2 TABLET BY MOUTH DAILY 45 tablet 3   ondansetron (ZOFRAN-ODT) 8 MG disintegrating tablet Take 1 tablet (8 mg total) by mouth every 8 (eight) hours as needed. 20 tablet 5   propranolol ER (INDERAL LA) 60 MG 24 hr capsule Take 1 capsule (60 mg total) by mouth daily. 30 capsule 5   QUEtiapine (SEROQUEL) 25 MG tablet Take 25 mg by mouth at bedtime as needed for sleep.     rizatriptan (MAXALT-MLT) 10 MG disintegrating tablet Take 1 tablet (10 mg total) by mouth as needed for migraine. May repeat in 2 hours if needed.  Maximum 2  tablets in 24 hours. 10 tablet 5   venlafaxine XR (EFFEXOR-XR) 75 MG 24 hr capsule Take 225 mg by mouth daily.  0   No current facility-administered medications on file prior to visit.    ALLERGIES: No Known Allergies  FAMILY HISTORY: Family History  Problem Relation Age of Onset   COPD Mother    Heart attack Father       Objective:  Blood pressure 105/74, pulse 90, height 5\' 5"  (1.651 m), weight 129 lb (58.5 kg), SpO2 99%.. General: No acute distress.  Patient appears well-groomed.   Head:  Normocephalic/atraumatic Eyes:  Fundi examined but not visualized Neck: supple, no paraspinal tenderness, full range of motion Heart:  Regular rate and rhythm Neurological Exam: alert and oriented.  Speech fluent and not dysarthric, language intact.  CN II-XII intact. Bulk and tone normal, muscle strength 5/5 throughout.  No tremor.  Sensation to light touch intact.  Deep tendon reflexes 2+ throughout.  Finger to nose testing intact.  Gait normal, Romberg negative.    Shon Millet, DO  CC: Olivia Mackie, MD

## 2023-06-25 ENCOUNTER — Ambulatory Visit: Payer: BC Managed Care – PPO | Admitting: Neurology

## 2023-06-25 ENCOUNTER — Other Ambulatory Visit: Payer: Self-pay | Admitting: Neurology

## 2023-06-25 ENCOUNTER — Encounter: Payer: Self-pay | Admitting: Neurology

## 2023-06-25 VITALS — BP 105/74 | HR 90 | Ht 65.0 in | Wt 129.0 lb

## 2023-06-25 DIAGNOSIS — G250 Essential tremor: Secondary | ICD-10-CM | POA: Diagnosis not present

## 2023-06-25 DIAGNOSIS — G521 Disorders of glossopharyngeal nerve: Secondary | ICD-10-CM

## 2023-06-25 DIAGNOSIS — G43009 Migraine without aura, not intractable, without status migrainosus: Secondary | ICD-10-CM | POA: Diagnosis not present

## 2023-06-25 MED ORDER — GABAPENTIN 100 MG PO CAPS: 100.0000 mg | ORAL_CAPSULE | Freq: Three times a day (TID) | ORAL | 5 refills | Status: DC | PRN

## 2023-06-25 MED ORDER — SUMATRIPTAN SUCCINATE 4 MG/0.5ML ~~LOC~~ SOAJ: 0.5000 mL | SUBCUTANEOUS | 5 refills | Status: DC | PRN

## 2023-06-25 NOTE — Patient Instructions (Addendum)
Help finding a primary care provider within [place]. If you do not have access to a computer or the Internet you can call [phone] and  they will help you scheduled with a provider.  Or you can go to: [email or web address]      Continue Aimovig When you wake up with migraine, take sumatriptan injection as directed. Continue propranolol ER 60mg  daily If you get a neuralgia flare up, take gabapentin 100mg  up to three times daily as needed.   Contact me if you would like to proceed with MRI Otherwise, follow up 5 months.

## 2023-06-28 ENCOUNTER — Other Ambulatory Visit: Payer: Self-pay | Admitting: Neurology

## 2023-06-29 ENCOUNTER — Other Ambulatory Visit: Payer: Self-pay | Admitting: Neurology

## 2023-06-29 MED ORDER — SUMATRIPTAN SUCCINATE 6 MG/0.5ML ~~LOC~~ SOLN: 6.0000 mg | SUBCUTANEOUS | 5 refills | Status: DC | PRN

## 2023-07-03 ENCOUNTER — Encounter (HOSPITAL_BASED_OUTPATIENT_CLINIC_OR_DEPARTMENT_OTHER): Payer: Self-pay

## 2023-07-03 ENCOUNTER — Other Ambulatory Visit: Payer: Self-pay

## 2023-07-03 ENCOUNTER — Emergency Department (HOSPITAL_BASED_OUTPATIENT_CLINIC_OR_DEPARTMENT_OTHER)
Admission: EM | Admit: 2023-07-03 | Discharge: 2023-07-03 | Disposition: A | Discharge status: Home / Self Care | Payer: BC Managed Care – PPO | Attending: Emergency Medicine | Admitting: Emergency Medicine

## 2023-07-03 ENCOUNTER — Emergency Department (HOSPITAL_BASED_OUTPATIENT_CLINIC_OR_DEPARTMENT_OTHER): Payer: BC Managed Care – PPO | Admitting: Radiology

## 2023-07-03 DIAGNOSIS — R079 Chest pain, unspecified: Secondary | ICD-10-CM | POA: Diagnosis not present

## 2023-07-03 DIAGNOSIS — R6884 Jaw pain: Secondary | ICD-10-CM | POA: Diagnosis not present

## 2023-07-03 DIAGNOSIS — E119 Type 2 diabetes mellitus without complications: Secondary | ICD-10-CM | POA: Insufficient documentation

## 2023-07-03 DIAGNOSIS — R0789 Other chest pain: Secondary | ICD-10-CM | POA: Diagnosis not present

## 2023-07-03 LAB — TROPONIN I (HIGH SENSITIVITY)
Troponin I (High Sensitivity): <2 ng/L (ref <18)
Troponin I (High Sensitivity): <2 ng/L (ref <18)

## 2023-07-03 LAB — PREGNANCY, URINE: Preg Test, Ur: NEGATIVE

## 2023-07-03 LAB — CBC
HCT: 43.2 % (ref 36.0–46.0)
Hemoglobin: 14.3 g/dL (ref 12.0–15.0)
MCH: 29.5 pg (ref 26.0–34.0)
MCHC: 33.1 g/dL (ref 30.0–36.0)
MCV: 89.1 fL (ref 80.0–100.0)
Platelets: 253 10*3/uL (ref 150–400)
RBC: 4.85 MIL/uL (ref 3.87–5.11)
RDW: 12.3 % (ref 11.5–15.5)
WBC: 7.4 10*3/uL (ref 4.0–10.5)
nRBC: 0 % (ref 0.0–0.2)

## 2023-07-03 LAB — BASIC METABOLIC PANEL
Anion gap: 6 (ref 5–15)
BUN: 10 mg/dL (ref 6–20)
CO2: 29 mmol/L (ref 22–32)
Calcium: 9.6 mg/dL (ref 8.9–10.3)
Chloride: 100 mmol/L (ref 98–111)
Creatinine, Ser: 0.79 mg/dL (ref 0.44–1.00)
GFR, Estimated: >60 mL/min (ref >60)
Glucose, Bld: 82 mg/dL (ref 70–99)
Potassium: 3.9 mmol/L (ref 3.5–5.1)
Sodium: 135 mmol/L (ref 135–145)

## 2023-07-03 NOTE — Discharge Instructions (Addendum)
As discussed, workup today overall reassuring.  Your heart enzymes are normal.  EKG appears unchanged from prior EKG on record.  Does not look like you are having a heart attack.  Will send in referral for cardiology to follow-up with.  Please do not hesitate to return if the worrisome signs and symptoms we discussed become apparent.

## 2023-07-03 NOTE — ED Notes (Signed)
Back from X/R.

## 2023-07-03 NOTE — ED Triage Notes (Signed)
She reports sudden onset of right-sided jaw pain yesterday evening x 2 episodes, each lasting about 1 1/2 minutes. She states this recurred today, combined with a diffuse feeling of "pressure" across upper pectoral area. Note: "EKG attempted at triage with some equipment issues. "Therefore, EKG performed in rm. 5 upon her arrival there. Her skin is normal, warm and dry and she is breathing normally.

## 2023-07-03 NOTE — ED Provider Notes (Signed)
[place] EMERGENCY DEPARTMENT AT [facility] Provider Note   CSN: [id number] Arrival date & time: 07/03/23  1200     History  Chief Complaint  Patient presents with   Chest Pain    [name] is a 52 y.o. female.   Chest Pain   52 year old female presents emergency department with complaints of chest pain and right jaw pain.  Patient states the symptoms again last night when she was laying in bed.  Reports sharp pain radiating from right jaw into her chest.  States that symptoms lasted for a minute or so before resolving.  States that she had an additional episode when she was lying in bed that lasted for a minute or so before resolving.  States that around 6 AM this morning again was laying in bed and had another episode that lasted the same amount of time.  Denies any fever, shortness of breath abdominal pain, nausea, vomiting.  Past medical history significant for atrial fibrillation with RVR with prior ablation, OCD, anxiety, diabetes mellitus,  Home Medications Prior to Admission medications   Medication Sig Start Date End Date Taking? Authorizing Provider  Dexmethylphenidate HCl 30 MG CP24 Take 1 capsule by mouth every morning. 09/10/21   [provider]  Erenumab-aooe (AIMOVIG) 140 MG/ML SOAJ INJECT 140 MG INTO THE SKIN EVERY 28 DAYS. 12/23/22   Everlena Allyna Pittsley, Adam R, DO  gabapentin (NEURONTIN) 100 MG capsule Take 1 capsule (100 mg total) by mouth 3 (three) times daily as needed. 06/25/23   Drema Dallas, DO  Multiple Vitamins-Minerals (MULTIVITAMIN WITH MINERALS) tablet Take 1 tablet by mouth daily.    [provider]  mupirocin ointment (BACTROBAN) 2 % Apply 1 application  topically daily. 08/13/21   [provider]  nadolol (CORGARD) 20 MG tablet TAKE 1/2 TABLET BY MOUTH DAILY 10/16/19   Allred, Fayrene Fearing, MD  ondansetron (ZOFRAN-ODT) 8 MG disintegrating tablet Take 1 tablet (8 mg total) by mouth every 8 (eight) hours as needed. 12/23/22   Drema Dallas,  DO  propranolol ER (INDERAL LA) 60 MG 24 hr capsule Take 1 capsule (60 mg total) by mouth daily. 12/23/22   Everlena Kutter Schnepf, Adam R, DO  QUEtiapine (SEROQUEL) 25 MG tablet Take 25 mg by mouth at bedtime as needed for sleep. 04/06/19   [provider]  SUMAtriptan (IMITREX) 6 MG/0.5ML SOLN injection Inject 0.5 mLs (6 mg total) into the skin as needed for migraine or headache. May repeat in 1 hour if headache persists or recurs.  Maximum 2 injections in 24 hours. 06/29/23   Drema Dallas, DO  venlafaxine XR (EFFEXOR-XR) 75 MG 24 hr capsule Take 225 mg by mouth daily. 04/03/17   [provider]      Allergies    Patient has no known allergies.    Review of Systems   Review of Systems  Cardiovascular:  Positive for chest pain.    Physical Exam Updated Vital Signs BP 119/81 (BP Location: Right Arm)   Pulse 78   Temp 97.8 F (36.6 C) (Oral)   Resp 14   LMP  (LMP Unknown)   SpO2 100%  Physical Exam Vitals and nursing note reviewed.  Constitutional:      General: She is not in acute distress.    Appearance: She is well-developed.  HENT:     Head: Normocephalic and atraumatic.  Eyes:     Conjunctiva/sclera: Conjunctivae normal.  Cardiovascular:     Rate and Rhythm: Normal rate and regular rhythm.  Pulses: Normal pulses.     Heart sounds: No murmur heard. Pulmonary:     Effort: Pulmonary effort is normal. No respiratory distress.     Breath sounds: Normal breath sounds. No wheezing, rhonchi or rales.  Chest:     Chest wall: No tenderness.  Abdominal:     Palpations: Abdomen is soft.     Tenderness: There is no abdominal tenderness.  Musculoskeletal:        General: No swelling.     Cervical back: Neck supple.     Right lower leg: No edema.     Left lower leg: No edema.  Skin:    General: Skin is warm and dry.     Capillary Refill: Capillary refill takes less than 2 seconds.  Neurological:     Mental Status: She is alert.  Psychiatric:        Mood and Affect: Mood  normal.     ED Results / Procedures / Treatments   Labs (all labs ordered are listed, but only abnormal results are displayed) Labs Reviewed  BASIC METABOLIC PANEL  CBC  PREGNANCY, URINE  TROPONIN I (HIGH SENSITIVITY)  TROPONIN I (HIGH SENSITIVITY)    EKG None  Radiology DG Chest 2 View  Result Date: 07/03/2023 CLINICAL DATA:  Chest pain and jaw pain. EXAM: CHEST - 2 VIEW COMPARISON:  02/03/2014 FINDINGS: The heart size and mediastinal contours are within normal limits. Both lungs are clear. The visualized skeletal structures are unremarkable. IMPRESSION: No active cardiopulmonary disease. Electronically Signed   By: Signa Kell M.D.   On: 07/03/2023 13:10    Procedures Procedures    Medications Ordered in ED Medications - No data to display  ED Course/ Medical Decision Making/ A&P                                 Medical Decision Making Amount and/or Complexity of Data Reviewed Labs: ordered. Radiology: ordered.   This patient presents to the ED for concern of chest pain, this involves an extensive number of treatment options, and is a complaint that carries with it a high risk of complications and morbidity.  The differential diagnosis includes ACS, PE, pneumothorax, pericarditis/myocarditis/tamponade, aortic dissection, GERD, musculoskeletal, pleurisy, fracture, other   Co morbidities that complicate the patient evaluation  See HPI   Additional history obtained:  Additional history obtained from EMR External records from outside source obtained and reviewed including hospital records   Lab Tests:  I Ordered, and personally interpreted labs.  The pertinent results include: No leukocytosis.  No evidence of anemia.  Platelets within range.  No Electra abnormalities.  No renal dysfunction.  His troponin less than 2 with repeat less than 2.  Urine pregnancy negative.   Imaging Studies ordered:  I ordered imaging studies including chest x-ray I  independently visualized and interpreted imaging which showed no acute cardiopulmonary abnormality I agree with the radiologist interpretation   Cardiac Monitoring: / EKG:  The patient was maintained on a cardiac monitor.  I personally viewed and interpreted the cardiac monitored which showed an underlying rhythm of: Sinus rhythm with RSR pattern in V1 and V2; present EKG 04/14/2021   Consultations Obtained:  N/a   Problem List / ED Course / Critical interventions / Medication management  Chest pain Reevaluation of the patient showed that the patient stayed the same I have reviewed the patients home medicines and have made adjustments as needed  Social Determinants of Health:  Denies tobacco, illicit drug use   Test / Admission - Considered:  Chest pain Vitals signs within normal range and stable throughout visit. Laboratory/imaging studies significant for: See above 52 year old female presents emergency department with complaints of chest pain.  Chest pain last about a minute or 2 when she is laying in bed with 3 episodes since last night.  No exertional component.  Patient's workup today overall reassuring.  Patient with delta negative troponin, lack of acute ischemic change on EKG; low suspicion for ACS.  Patient with heart score of 0-3.  Patient without chest pain rating to back, hypertension, neurologic deficits, pulse deficits; low suspicion for dissection.  Chest x-ray without evidence of pneumonia, pneumothorax or other abnormality.  Patient without evidence of volume overload without pleural effusion/pulmonary vas congestion on chest x-ray; low suspicion for CHF.  Patient without pleuritic chest pain, tachycardia, tachypnea; no risk factors for PE.  Patient with low Pesi score.  Doubt PE.  Unsure of exact etiology of patient's chest pain.  Will recommend close follow-up with cardiology/PCP in the outpatient setting for reevaluation.  Treatment plan discussed at length with  patient and she acknowledged understanding was agreeable to said plan.  Patient overall well-appearing, afebrile in no acute distress. Worrisome signs and symptoms were discussed with the patient, and the patient acknowledged understanding to return to the ED if noticed. Patient was stable upon discharge.          Final Clinical Impression(s) / ED Diagnoses Final diagnoses:  Chest pain, unspecified type    Rx / DC Orders ED Discharge Orders          Ordered    Ambulatory referral to Cardiology        07/03/23 1538              Peter Garter, [place] 07/03/23 1552    Rolan Bucco, MD 07/04/23 [id number]

## 2023-07-14 DIAGNOSIS — D2262 Melanocytic nevi of left upper limb, including shoulder: Secondary | ICD-10-CM | POA: Diagnosis not present

## 2023-07-14 DIAGNOSIS — Z85820 Personal history of malignant melanoma of skin: Secondary | ICD-10-CM | POA: Diagnosis not present

## 2023-07-14 DIAGNOSIS — D225 Melanocytic nevi of trunk: Secondary | ICD-10-CM | POA: Diagnosis not present

## 2023-07-14 DIAGNOSIS — L821 Other seborrheic keratosis: Secondary | ICD-10-CM | POA: Diagnosis not present

## 2023-07-20 DIAGNOSIS — F900 Attention-deficit hyperactivity disorder, predominantly inattentive type: Secondary | ICD-10-CM | POA: Diagnosis not present

## 2023-07-20 DIAGNOSIS — F429 Obsessive-compulsive disorder, unspecified: Secondary | ICD-10-CM | POA: Diagnosis not present

## 2023-07-20 DIAGNOSIS — F411 Generalized anxiety disorder: Secondary | ICD-10-CM | POA: Diagnosis not present

## 2023-08-16 ENCOUNTER — Ambulatory Visit: Payer: BC Managed Care – PPO | Admitting: Cardiology

## 2023-08-28 ENCOUNTER — Other Ambulatory Visit: Payer: Self-pay | Admitting: Neurology

## 2023-09-22 DIAGNOSIS — F900 Attention-deficit hyperactivity disorder, predominantly inattentive type: Secondary | ICD-10-CM | POA: Diagnosis not present

## 2023-09-22 DIAGNOSIS — F411 Generalized anxiety disorder: Secondary | ICD-10-CM | POA: Diagnosis not present

## 2023-09-22 DIAGNOSIS — F429 Obsessive-compulsive disorder, unspecified: Secondary | ICD-10-CM | POA: Diagnosis not present

## 2023-09-28 NOTE — Progress Notes (Signed)
  Cardiology Office Note:  .   Date:  09/28/2023  ID:  [name], DOB Apr 15, 1971, MRN [id number] PCP: Patient, No Pcp Per  [facility] Providers Cardiologist:  None    History of Present Illness: .   [name] is a 53 y.o. female with history of anxiety, atrial fibrillation diagnosed 01/2014, A1c 5.4 11/2021 who was a former patient of Dr. Kelsie last seen in 2021.  She is now coming in with chest pain.  She presents with a single episode of right-sided chest pain and right jaw pain. The pain was unlike anything she had experienced before, except during a previous significant AFib episode. The pain was prolonged but not associated with a racing heart or arrhythmia.  In addition to the chest pain, she reports frequent palpitations, which she describes as her heart skipping beats. These episodes occur several times a week and are significant enough to induce a coughing reflex. She manages these episodes with nadolol , which she takes approximately three times a week.  She previously had a loop recorder, which was removed when the battery died. She has not had any recurrence of AFib that she is aware of.        EKG in November in was normal.  ROS:  per HPI otherwise negative   Studies Reviewed: .         Risk Assessment/Calculations:    CHA2DS2-VASc Score = 2   This indicates a 2.2% annual risk of stroke. The patient's score is based upon: CHF History: 0 HTN History: 0 Diabetes History: 1 Stroke History: 0 Vascular Disease History: 0 Age Score: 0 Gender Score: 1          Physical Exam:   VS:   Vitals:   09/29/23 1339  BP: 110/78  Pulse: 97  SpO2: 98%    Wt Readings from Last 3 Encounters:  06/25/23 129 lb (58.5 kg)  12/23/22 136 lb 9.6 oz (62 kg)  05/28/22 155 lb 3.2 oz (70.4 kg)    GEN: Well nourished, well developed in no acute distress NECK: No JVD; No carotid bruits CARDIAC: RRR, no murmurs, rubs, gallops RESPIRATORY:  Clear to auscultation without  rales, wheezing or rhonchi  ABDOMEN: Soft, non-tender, non-distended EXTREMITIES:  No edema; No deformity   ASSESSMENT AND PLAN: .    Chest Pain Single episode of right-sided chest pain with jaw discomfort, not associated with tachycardia or arrhythmia. No recurrence since the episode. She is low risk for CVD.   Palpitations Frequent palpitations, possibly related to atrial fibrillation. Patient reports needing to take nadolol  approximately three times a week to manage symptoms. -Order a 7-day Ziopatch to monitor heart rhythm and correlate with symptoms. -Continue nadolol  as needed.  Hx of  Paroxysmal atrial fibrillation Chads2vasc=1 for gender She has a low stroke risk.  No need for anticoagulation..  Dispo: Follow-up in 6 months with an APP.       Signed, Alvan Ronal BRAVO, MD

## 2023-09-29 ENCOUNTER — Ambulatory Visit: Payer: BC Managed Care – PPO | Attending: Internal Medicine

## 2023-09-29 ENCOUNTER — Ambulatory Visit: Payer: BC Managed Care – PPO | Attending: Internal Medicine | Admitting: Internal Medicine

## 2023-09-29 ENCOUNTER — Encounter: Payer: Self-pay | Admitting: Internal Medicine

## 2023-09-29 VITALS — BP 110/78 | HR 97 | Ht 65.0 in | Wt 141.4 lb

## 2023-09-29 DIAGNOSIS — R002 Palpitations: Secondary | ICD-10-CM

## 2023-09-29 NOTE — Patient Instructions (Signed)
 Medication Instructions:  No changes  *If you need a refill on your cardiac medications before your next appointment, please call your pharmacy*   Lab Work: None  If you have labs (blood work) drawn today and your tests are completely normal, you will receive your results only by: MyChart Message (if you have MyChart) OR A paper copy in the mail If you have any lab test that is abnormal or we need to change your treatment, we will call you to review the results.   Testing/Procedures: Your physician has recommended that you wear a 7 DAY ZIO-PATCH monitor. The Zio patch cardiac monitor continuously records heart rhythm data for up to 14 days, this is for patients being evaluated for multiple types heart rhythms. For the first 24 hours post application, please avoid getting the Zio monitor wet in the shower or by excessive sweating during exercise. After that, feel free to carry on with regular activities. Keep soaps and lotions away from the ZIO XT Patch.  This will be mailed to you, please expect 7-10 days to receive.    Applying the monitor   Shave hair from upper left chest.   Hold abrader disc by orange tab.  Rub abrader in 40 strokes over left upper chest as indicated in your monitor instructions.   Clean area with 4 enclosed alcohol pads .  Use all pads to assure are is cleaned thoroughly.  Let dry.   Apply patch as indicated in monitor instructions.  Patch will be place under collarbone on left side of chest with arrow pointing upward.   Rub patch adhesive wings for 2 minutes.Remove white label marked 1.  Remove white label marked 2.  Rub patch adhesive wings for 2 additional minutes.   While looking in a mirror, press and release button in center of patch.  A small green light will flash 3-4 times .  This will be your only indicator the monitor has been turned on.     Do not shower for the first 24 hours.  You may shower after the first 24 hours.   Press button if you feel  a symptom. You will hear a small click.  Record Date, Time and Symptom in the Patient Log Book.   When you are ready to remove patch, follow instructions on last 2 pages of Patient Log Book.  Stick patch monitor onto last page of Patient Log Book.   Place Patient Log Book in [place] box.  Use locking tab on box and tape box closed securely.  The [employer] has [employer] on it.  Please place in mailbox as soon as possible.  Your physician should have your test results approximately 7 days after the monitor has been mailed back to [facility].   Call [facility] Customer Care at [phone] if you have questions regarding your ZIO XT patch monitor.  Call them immediately if you see an orange light blinking on your monitor.   If your monitor falls off in less than 4 days contact our Monitor department at [phone].  If your monitor becomes loose or falls off after 4 days call [employer] at [phone] for suggestions on securing your monitor    Follow-Up: At [facility], you and your health needs are our priority.  As part of our continuing mission to provide you with exceptional heart care, we have created designated Provider Care Teams.  These Care Teams include your primary Cardiologist (physician) and Advanced Practice Providers (APPs -  Physician Assistants and Nurse Practitioners) who all work together to provide you with the care you need, when you need it.  Your next appointment:   6 month(s)  Provider:   With any APP    Other Instructions

## 2023-09-29 NOTE — Progress Notes (Unsigned)
 Enrolled for [employer] to mail a ZIO XT long term holter monitor to the patients address on file.

## 2023-10-01 ENCOUNTER — Other Ambulatory Visit: Payer: Self-pay | Admitting: Neurology

## 2023-10-03 DIAGNOSIS — R002 Palpitations: Secondary | ICD-10-CM

## 2023-10-26 ENCOUNTER — Telehealth: Payer: Self-pay | Admitting: *Deleted

## 2023-10-26 NOTE — Telephone Encounter (Signed)
 Spoke with pt, aware of dr branch's recommendations.

## 2023-10-26 NOTE — Telephone Encounter (Signed)
 Spoke with pt, she reports that recently her neurologist increased her propranolol to daily, it used to be as needed. She reports she has only been taking the daily dose for less than one week. She does use the nadolol as needed with the SVT. Aware will forward to dr branch to see if she continues with the propranolol daily and continue to use the nadolol as needed or wait to see if the propranolol alone will help with the SVT.

## 2023-10-26 NOTE — Telephone Encounter (Signed)
-----   Message from Carolan Clines E sent at 10/20/2023  4:09 PM EST ----- Please let [name] know that she had some SVT but no atrial fibrillation.  Since she was symptomatic from it she can take her beta-blocker.  Please confirm if she is taking the propranolol or the nadolol.  She can take either 1 on a more consistent basis.

## 2023-11-10 DIAGNOSIS — Z83719 Family history of colon polyps, unspecified: Secondary | ICD-10-CM | POA: Diagnosis not present

## 2023-11-10 DIAGNOSIS — Z860101 Personal history of adenomatous and serrated colon polyps: Secondary | ICD-10-CM | POA: Diagnosis not present

## 2023-11-18 DIAGNOSIS — K5730 Diverticulosis of large intestine without perforation or abscess without bleeding: Secondary | ICD-10-CM | POA: Diagnosis not present

## 2023-11-18 DIAGNOSIS — Z860101 Personal history of adenomatous and serrated colon polyps: Secondary | ICD-10-CM | POA: Diagnosis not present

## 2023-11-18 DIAGNOSIS — D123 Benign neoplasm of transverse colon: Secondary | ICD-10-CM | POA: Diagnosis not present

## 2023-11-18 DIAGNOSIS — Z09 Encounter for follow-up examination after completed treatment for conditions other than malignant neoplasm: Secondary | ICD-10-CM | POA: Diagnosis not present

## 2023-11-23 NOTE — Progress Notes (Unsigned)
 NEUROLOGY FOLLOW UP OFFICE NOTE  [name] [id number]  Assessment/Plan:   Migraine without aura, without status migrainosus, not intractable Essential tremor Glossopharyngeal neuralgia   Migraine prevention:  Aimovig 140mg  every 28 days. *** Migraine rescue:  Ssumatriptan 4mg  [place], Zofran ODT 8mg  *** For tremor: propranolol ER 60mg  daily.  *** For glossopharyngeal neuralgia flares: gabapentin 100mg  to take as needed *** Limit use of pain relievers to no more than 2 days out of week to prevent risk of rebound or medication-overuse headache. Keep headache diary Follow up ***   Subjective:  [name] is a 53 year old right-handed female with paroxysmal atrial fibrillation with RVR s/p ablation, and history of melanoma, who follows up for migraines.   UPDATE: Migraine frequency fairly stable but acute treatments not effective (rizatriptan, Advil, Benadryl), especially since she primarily wakes up with the migraine. Intensity:  moderate Duration:  several hours. Frequency:  4-5 a month   Essential tremor is stable on propranolol     Current NSAIDS/analgesics:  Tylenol, Advil Current triptans:  sumatriptan 4mg  [place] Current ergotamine:  none Current anti-emetic:  Zofran ODT 8mg  Current muscle relaxants:  none Current Antihypertensive medications:  propranolol ER 60mg  daily, nadolol PRN (rarely takes) Current Antidepressant medications:  Venlafaxine XR 75mg  daily Current Anticonvulsant medications:  gabapentin 100mg  PRN (glossopharyngeal neuralgia) Current anti-CGRP:  Aimovig 140mg  Current Vitamins/Herbal/Supplements:  MVI Current Antihistamines/Decongestants:  none Other therapy:  Ice, compression Hormone/birth control:  none Other medications:  Dexmethylphenidate, Seroquel 100mg  QHS PRN   Caffeine:  2 cups coffee in AM, 1 diet Coke sometimes Diet:  Poor water intake.  Only skips meals if wakes up nauseous Exercise:  routine Depression:  controlled; Anxiety:  controlled Other pain:  no Sleep:  Varies.  Uses Seroquel as needed.   HISTORY:  Migraines: She has had migraines since age 53.  They are typically severe right-sided pounding headache usually preceded by blurred vision/enlarged blind spot in right eye and neck pain.  Associated nausea, vomiting, photophobia and phonophobia.  They occurred on and off until college and then they have occurred infrequently, but when they would occur, she often would require an ED visit.  About 2 to 3 years ago, she began having new headaches which have progressively become more frequent.  They are moderate pulsating pain usually in right temple but sometimes behind both eyes or back of head and neck.  No preceding aura.  Associated nausea, photophobia and phonophobia.  Often will wake up with them and will last until the next day.  Afterwards, her head feels sore.  Change in barometric pressure is a common trigger.  Bending over aggravates the headache.  She is unable to eat or exercise.  Cold compresses or other pressure helps relieve the pain.  Usually occurs a day prior to start of her menstrual cycle but on average occurs 3 to 4 days a month.     On 05/13/2017, she was seen in the ED for an intractable migraine.  MRI and MRA of brain without contrast were normal.  MRI of brain with and without contrast (performed under sedation) on 09/10/2020 showed few scattered punctate T2/FLAIR foci within the cerebral white matter  Essential tremor: Essential tremor runs in her family.  Both her mom and sister have it.  It comes and goes, often more noticeable later in day or evening.  Her hand will shake with use.  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  Glossopharyngeal Neuralgia: Beginning in November 2022, she started experiencing recurrent episodes of severe sharp deep pain in her left ear radiating to the back of her throat and posterior tongue with associated numbness.  No congestion  or change in hearing.  She thought that it could have been a nerve problem, possibly zoster, but she never developed a rash.  It lasted about a week and then resolved.  Since then, she has had recurrent episodes usually lasting about a day and occurring approximately every 4 months.    Around the same time, she developed right elbow pain with pain and paresthesias radiating over her forearm to the last two digits of her right hand.  Her elbow is quite tender to touch.  She also endorses weakness with difficulty opening jars.  She has right sided neck pain that isn't new.  She reports history of possible Raynaud's involving the fifth digit.     In January 2023, she started developing pain in the anterior right thigh down to the knee.  It is a burning pain.  Thigh sore to touch.  It is not aggravated by bearing weight, only if touching her thigh.  Denies back pain, numbness, tingling, leg weakness or bowel/bladder dysfunction.  She notes that she had a melanoma removed on her right arm just above the elbow as well as above the right knee.  NCV-EMG of right upper extremity on 10/14/2021 was normal.  The symptoms resolved.  Still mild discomfort only in right middle finger which may be arthritis.  Right lower extremity was deferred as symptoms resolved.       Past NSAIDS/analgesics:  Midrin, Elyxyb Past abortive triptans:  sumatriptan tablet, rizatriptan-MLT 10mg  Past abortive ergotamine:  none Past muscle relaxants:  none Past anti-emetic:  Zofran Past antihypertensive medications:  Propranolol  Past antidepressant medications:  Amitriptyline, fluoxetine, Wellbutrin Past anticonvulsant medications:  none Past anti-CGRP:  Nurtec, Nicolasa Ducking Past vitamins/Herbal/Supplements:  none Past antihistamines/decongestants:  cyproheptadine Other past therapies:  none      Family history of headache:  Sister (migraines) No family history of aneurysm.    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 States her A1C is always in the normal range - per pt on 09/06/20   Headache    hx of migraines and went away and now have come back   Insomnia    OCD (obsessive compulsive disorder)    SVT (supraventricular tachycardia) (HCC)     MEDICATIONS: Current Outpatient Medications on File Prior to Visit  Medication Sig Dispense Refill   Dexmethylphenidate HCl 30 MG CP24 Take 1 capsule by mouth every morning.     Erenumab-aooe (AIMOVIG) 140 MG/ML SOAJ INJECT 140 MG INTO THE SKIN EVERY 28 DAYS. 1 mL 11   gabapentin (NEURONTIN) 100 MG capsule Take 1 capsule (100 mg total) by mouth 3 (three) times daily as needed. 90 capsule 5   Multiple Vitamins-Minerals (MULTIVITAMIN WITH MINERALS) tablet Take 1 tablet by mouth daily.     mupirocin ointment (BACTROBAN) 2 % Apply 1  application  topically daily.     nadolol (CORGARD) 20 MG tablet TAKE 1/2 TABLET BY MOUTH DAILY 45 tablet 3   ondansetron (ZOFRAN-ODT) 8 MG disintegrating tablet TAKE 1 TABLET BY MOUTH EVERY 8 HOURS AS NEEDED 20 tablet 5   propranolol ER (INDERAL LA) 60 MG 24 hr capsule TAKE 1 CAPSULE BY MOUTH EVERY DAY 90 capsule 1   QUEtiapine (SEROQUEL) 25 MG tablet Take 25 mg by mouth at bedtime as needed for sleep.     SUMAtriptan (IMITREX) 6 MG/0.5ML SOLN injection Inject 0.5 mLs (6 mg total) into the skin as needed for migraine or headache. May repeat in 1 hour if headache persists or recurs.  Maximum 2 injections in 24 hours. 3 mL 5   venlafaxine XR (EFFEXOR-XR) 75 MG 24 hr capsule Take 225 mg by mouth daily.  0   No current facility-administered medications on file prior to  visit.    ALLERGIES: No Known Allergies  FAMILY HISTORY: Family History  Problem Relation Age of Onset   COPD Mother    Heart attack Father       Objective:  *** General: No acute distress.  Patient appears well-groomed.   ***    Shon Millet, DO

## 2023-11-24 ENCOUNTER — Encounter: Payer: Self-pay | Admitting: Neurology

## 2023-11-24 ENCOUNTER — Ambulatory Visit: Payer: BC Managed Care – PPO | Admitting: Neurology

## 2023-11-24 VITALS — BP 108/72 | HR 95 | Ht 65.0 in | Wt 145.2 lb

## 2023-11-24 DIAGNOSIS — H8110 Benign paroxysmal vertigo, unspecified ear: Secondary | ICD-10-CM | POA: Diagnosis not present

## 2023-11-24 DIAGNOSIS — G521 Disorders of glossopharyngeal nerve: Secondary | ICD-10-CM | POA: Diagnosis not present

## 2023-11-24 DIAGNOSIS — G43009 Migraine without aura, not intractable, without status migrainosus: Secondary | ICD-10-CM | POA: Diagnosis not present

## 2023-11-24 DIAGNOSIS — G250 Essential tremor: Secondary | ICD-10-CM

## 2023-11-24 MED ORDER — AIMOVIG 140 MG/ML ~~LOC~~ SOAJ: SUBCUTANEOUS | 11 refills | Status: AC

## 2023-11-24 NOTE — Patient Instructions (Addendum)
 Help finding a primary care provider within [place]. If you do not have access to a computer or the Internet you can call [phone] and  they will help you scheduled with a provider.  Or you can go to: [email or web address]     Continue Aimovig, sumatriptan, ondansetron Continue propranolol for tremor If nerve pain flares up, try taking 2 gabapentin.  Let me know If dizzy spells persist, contact me and I will refer you to physical therapy for vestibular rehabilitation Follow up 6 months.

## 2023-12-22 ENCOUNTER — Other Ambulatory Visit: Payer: Self-pay | Admitting: Obstetrics and Gynecology

## 2023-12-22 DIAGNOSIS — Z1231 Encounter for screening mammogram for malignant neoplasm of breast: Secondary | ICD-10-CM

## 2024-01-12 DIAGNOSIS — H8111 Benign paroxysmal vertigo, right ear: Secondary | ICD-10-CM | POA: Diagnosis not present

## 2024-01-20 ENCOUNTER — Encounter: Payer: Self-pay | Admitting: Neurology

## 2024-01-20 DIAGNOSIS — Z85820 Personal history of malignant melanoma of skin: Secondary | ICD-10-CM

## 2024-01-20 DIAGNOSIS — H8110 Benign paroxysmal vertigo, unspecified ear: Secondary | ICD-10-CM

## 2024-01-24 ENCOUNTER — Ambulatory Visit
Admission: RE | Admit: 2024-01-24 | Discharge: 2024-01-24 | Disposition: A | Discharge status: Home / Self Care | Source: Ambulatory Visit | Attending: Obstetrics and Gynecology | Admitting: Obstetrics and Gynecology

## 2024-01-24 DIAGNOSIS — Z1231 Encounter for screening mammogram for malignant neoplasm of breast: Secondary | ICD-10-CM

## 2024-01-25 DIAGNOSIS — Z85820 Personal history of malignant melanoma of skin: Secondary | ICD-10-CM | POA: Diagnosis not present

## 2024-01-25 DIAGNOSIS — D225 Melanocytic nevi of trunk: Secondary | ICD-10-CM | POA: Diagnosis not present

## 2024-01-25 DIAGNOSIS — D2272 Melanocytic nevi of left lower limb, including hip: Secondary | ICD-10-CM | POA: Diagnosis not present

## 2024-01-25 DIAGNOSIS — L821 Other seborrheic keratosis: Secondary | ICD-10-CM | POA: Diagnosis not present

## 2024-01-27 ENCOUNTER — Ambulatory Visit: Admitting: Neurology

## 2024-02-03 NOTE — Progress Notes (Signed)
 NEUROLOGY FOLLOW UP OFFICE NOTE  [name] [id number]  Assessment/Plan:   Vertigo.  History and exam seems consistent with benign paroxysmal positional vertigo, however she has not responded to Epley maneuver.  Will check for secondary intracranial or internal auditory etiologies.  Also, with right sided neck pain, consider cervicogenic etiology.   Migraine without aura, without status migrainosus, not intractable Essential tremor Glossopharyngeal neuralgia   MRI of brain/IAC with and without contrast and MRI of cervical spine without contrast under sedation Migraine prevention:  Aimovig  140mg  every 28 days.  Migraine rescue:  Sumatriptan  4mg  [name], Zofran  ODT 8mg   For tremor: propranolol  ER 60mg  daily.   For glossopharyngeal neuralgia flares: Gabapentin  200mg  to take as needed.  If it is frequent, may need to take daily and not as needed. Limit use of pain relievers to no more than 9 days out of the month to prevent risk of rebound or medication-overuse headache. Keep headache diary Follow up 6 months.  Total time spent in chart and face to face with patient:  20 minutes   Subjective:  [name] is a 53 year old right-handed female with paroxysmal atrial fibrillation with RVR s/p ablation, SVT, and history of melanoma, who follows up for vertigo.  She is accompanied by her husband who supplements history.   UPDATE: Vertigo: She saw ENT in May 2025.  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  She was instructed on performing home Epley maneuver and referred to physical therapy.  However, she has not had relief.  She continues to have onset of vertigo with brief movements, such as when she looks up in the shower to wash her hair or if she quickly turns to talk to somebody.  She also has had increased right sided neck pain with pain radiating up the back of her head.  She is more cautious when she walks.  Denies diplopia, tinnitus or hearing loss.    Migraines: Migraine frequency fairly  stable but acute treatments not effective (rizatriptan , Advil , Benadryl ), especially since she primarily wakes up with the migraine. Intensity:  moderate Duration:  20 minutes with sumatriptan  [name] Frequency:  Usually 2 a month.  6 migraines in last month due to the weather (4 were just visual aura - floaters - and 2 with pain)   Essential tremor is stable on propranolol   Stopped gabapentin  because it didn't help.  But hasn't had an episode in 6 weeks.   Current NSAIDS/analgesics:  Tylenol , Advil  Current triptans:  sumatriptan  4mg  [name] Current ergotamine:  none Current anti-emetic:  Zofran  ODT 8mg  Current muscle relaxants:  none Current Antihypertensive medications:  propranolol  ER 60mg  daily, nadolol  PRN (rarely takes) Current Antidepressant medications:  Venlafaxine XR 75mg  daily Current Anticonvulsant medications:  gabapentin  100mg  PRN (glossopharyngeal neuralgia, has not needed) Current anti-CGRP:  Aimovig  140mg  Current Vitamins/Herbal/Supplements:  MVI Current Antihistamines/Decongestants:  none Other therapy:  Ice, compression Hormone/birth control:  none Other medications:  Dexmethylphenidate, Seroquel  100mg  QHS PRN   Caffeine:  2 cups coffee in AM, 1 diet Coke sometimes Diet:  Poor water intake.  Only skips meals if wakes up nauseous Exercise:  routine Depression:  controlled; Anxiety: controlled Other pain:  no Sleep:  Varies.  Uses Seroquel  as needed.   HISTORY:  Migraines: She has had migraines since age 51.  They are typically severe right-sided pounding headache usually preceded by blurred vision/enlarged blind spot in right eye and neck pain.  Associated nausea, vomiting, photophobia and phonophobia.  They occurred on and off until  college and then they have occurred infrequently, but when they would occur, she often would require an ED visit.  About 2 to 3 years ago, she began having new headaches which have progressively become more frequent.  They are moderate pulsating  pain usually in right temple but sometimes behind both eyes or back of head and neck.  No preceding aura.  Associated nausea, photophobia and phonophobia.  Often will wake up with them and will last until the next day.  Afterwards, her head feels sore.  Change in barometric pressure is a common trigger.  Bending over aggravates the headache.  She is unable to eat or exercise.  Cold compresses or other pressure helps relieve the pain.  Usually occurs a day prior to start of her menstrual cycle but on average occurs 3 to 4 days a month.     On 05/13/2017, she was seen in the ED for an intractable migraine.  MRI and MRA of brain without contrast were normal.  MRI of brain with and without contrast (performed under sedation) on 09/10/2020 showed few scattered punctate T2/FLAIR foci within the cerebral white matter  Essential tremor: Essential tremor runs in her family.  Both her mom and sister have it.  It comes and goes, often more noticeable later in day or evening.  Her hand will shake with use.  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     Glossopharyngeal Neuralgia: Beginning in November 2022, she started experiencing recurrent episodes of severe sharp deep pain in her left ear radiating to the back of her throat and posterior tongue with associated numbness.  No congestion or change in hearing.  She thought that it could have been a nerve problem, possibly zoster, but she never developed a rash.  It lasted about a week and then resolved.  Since then, she has had recurrent episodes usually lasting about a day and occurring approximately every 4 months.    Pain: Around the same time, she developed right elbow pain with pain and paresthesias radiating over her forearm to the last two digits of her right hand.  Her elbow is quite tender to touch.  She also endorses weakness with difficulty opening jars.  She has right sided neck pain that isn't new.  She reports history  of possible Raynaud's involving the fifth digit.     In January 2023, she started developing pain in the anterior right thigh down to the knee.  It is a burning pain.  Thigh sore to touch.  It is not aggravated by bearing weight, only if touching her thigh.  Denies back pain, numbness, tingling, leg weakness or bowel/bladder dysfunction.  She notes that she had a melanoma removed on her right arm just above the elbow as well as above the right knee.  NCV-EMG of right upper extremity on 10/14/2021 was normal.  The symptoms resolved.  Still mild discomfort only in right middle finger which may be arthritis.  Right lower extremity was deferred as symptoms resolved.    Vertigo: She has been feeling dizzy, a spinning sensation, beginning in March 2025.  Lasts a minute.  Positional.  Occurs mostly when laying down or turning in bed.  When she is upright, she feels a little more lightheaded.       Past NSAIDS/analgesics:  Midrin, Elyxyb Past abortive triptans:  sumatriptan  tablet, rizatriptan -MLT 10mg  Past abortive ergotamine:  none Past muscle relaxants:  none Past anti-emetic:  Zofran  Past antihypertensive  medications:  Propranolol   Past antidepressant medications:  Amitriptyline, fluoxetine, Wellbutrin  Past anticonvulsant medications:  none Past anti-CGRP:  Nurtec, Qulipta , Ubrelvy  Past vitamins/Herbal/Supplements:  none Past antihistamines/decongestants:  cyproheptadine  Other past therapies:  none      Family history of headache:  Sister (migraines) No family history of aneurysm.    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 States her A1C is always in the normal range - per pt on 09/06/20   Headache    hx of migraines and went away and now have come back   Insomnia    OCD (obsessive compulsive disorder)    SVT (supraventricular tachycardia) (HCC)     MEDICATIONS: Current Outpatient Medications on File Prior to Visit  Medication Sig Dispense Refill   dexmethylphenidate (FOCALIN XR) 10 MG 24 hr capsule Take 10 mg by mouth in the morning and at bedtime.     Erenumab -aooe (AIMOVIG ) 140 MG/ML SOAJ INJECT 140 MG INTO THE SKIN EVERY 28 DAYS. 1 mL 11   gabapentin  (NEURONTIN ) 100 MG capsule Take 1 capsule (100 mg total) by mouth 3 (three) times daily as needed. 90 capsule 5   Multiple Vitamins-Minerals (MULTIVITAMIN WITH MINERALS) tablet Take 1 tablet by mouth daily.     nadolol  (CORGARD ) 20 MG tablet TAKE 1/2 TABLET BY MOUTH DAILY 45 tablet 3   ondansetron  (ZOFRAN -ODT) 8 MG disintegrating tablet TAKE 1 TABLET BY MOUTH EVERY 8 HOURS AS NEEDED 20 tablet 5   propranolol  ER (INDERAL  LA) 60 MG 24 hr capsule TAKE 1 CAPSULE BY MOUTH EVERY DAY 90 capsule 1   QUEtiapine  (SEROQUEL ) 25 MG tablet Take 25 mg by mouth at bedtime as needed for sleep.     SUMAtriptan  (IMITREX ) 6 MG/0.5ML SOLN injection Inject 0.5 mLs (6 mg total) into the skin as needed for migraine or headache. May repeat in 1 hour if headache persists or recurs.  Maximum 2 injections in 24 hours. 3 mL 5   venlafaxine XR (EFFEXOR-XR) 75 MG 24 hr capsule Take 225 mg by mouth daily.  0   No current facility-administered medications on file prior to visit.     ALLERGIES: No Known Allergies  FAMILY HISTORY: Family History  Problem Relation Age of Onset   COPD Mother    Heart attack Father       Objective:  Blood pressure 94/70, pulse 86, height 5' 5 (1.651 m), weight 138 lb (62.6 kg), SpO2 96%. General: No acute distress.  Patient appears well-groomed.   Head:  Normocephalic/atraumatic Neck:  Supple.  Right paraspinal tenderness.  Full range of  motion. Heart:  Regular rate and rhythm. Lungs:  Clear to auscultation bilaterally Neuro:  Alert and oriented.  Speech fluent and not dysarthric.  Language intact.  CN II-XII intact.  Positive Head Impulse Test right.  Bulk and tone normal.  Muscle strength 5/5 throughout.  Sensation to light touch intact.  Deep tendon reflexes 2+ throughout, toes downgoing.  Gait normal.  Able to turn.  Slowed tandem walk.  [place] negative.  Janne Members, DO

## 2024-02-04 ENCOUNTER — Ambulatory Visit: Admitting: Neurology

## 2024-02-04 ENCOUNTER — Encounter: Payer: Self-pay | Admitting: Neurology

## 2024-02-04 VITALS — BP 94/70 | HR 86 | Ht 65.0 in | Wt 138.0 lb

## 2024-02-04 DIAGNOSIS — M542 Cervicalgia: Secondary | ICD-10-CM | POA: Diagnosis not present

## 2024-02-04 DIAGNOSIS — R42 Dizziness and giddiness: Secondary | ICD-10-CM

## 2024-02-04 MED ORDER — ONABOTULINUMTOXINA 100 UNITS IJ SOLR: 200.0000 [IU] | Freq: Once | INTRAMUSCULAR | Status: DC

## 2024-02-04 NOTE — Addendum Note (Signed)
 Addended by: Michalene Agee on: 02/04/2024 03:35 PM   Modules accepted: Orders

## 2024-02-09 ENCOUNTER — Encounter (HOSPITAL_COMMUNITY): Payer: Self-pay

## 2024-02-09 ENCOUNTER — Other Ambulatory Visit: Payer: Self-pay

## 2024-02-09 NOTE — Progress Notes (Signed)
 SDW CALL  Patient was given pre-op instructions over the phone. The opportunity was given for the patient to ask questions. No further questions asked. Patient verbalized understanding of instructions given.   PCP - Melida Sprain, MD   Cardiologist - none at this previous one retired  PM/ICD - denies (per patient loop recorder removed A fib controlled by medication) Device Orders - n/a Rep Notified - n/a  Chest x-ray - 07-03-23 EKG - 07-03-23 Stress Test - denies ECHO - denies Cardiac Cath - denies  Sleep Study - per patient > 5 years ago CPAP - no  DM denies per patient she is not diabetic   Blood Thinner Instructions:denies Aspirin Instructions:n/a  ERAS Protcol - NPO   COVID TEST- n/a   Anesthesia review: yes due complications of anesthesia  wakes up early  Patient denies shortness of breath, fever, cough and chest pain over the phone call   All instructions explained to the patient, with a verbal understanding of the material. Patient agrees to go over the instructions while at home for a better understanding.

## 2024-02-09 NOTE — Anesthesia Preprocedure Evaluation (Addendum)
 Anesthesia Evaluation  Patient identified by MRN, date of birth, ID band Patient awake    Reviewed: Allergy & Precautions, H&P , NPO status , Patient's Chart, lab work & pertinent test results  History of Anesthesia Complications (+) history of anesthetic complications  Airway Mallampati: I  TM Distance: >3 FB Neck ROM: Full    Dental no notable dental hx. (+) Teeth Intact, Dental Advisory Given   Pulmonary neg pulmonary ROS   Pulmonary exam normal breath sounds clear to auscultation       Cardiovascular Exercise Tolerance: Good negative cardio ROS Normal cardiovascular exam Rhythm:Regular Rate:Normal     Neuro/Psych negative neurological ROS  negative psych ROS   GI/Hepatic negative GI ROS, Neg liver ROS,,,  Endo/Other  negative endocrine ROS    Renal/GU negative Renal ROS  negative genitourinary   Musculoskeletal negative musculoskeletal ROS (+)    Abdominal   Peds negative pediatric ROS (+)  Hematology negative hematology ROS (+)   Anesthesia Other Findings   Reproductive/Obstetrics negative OB ROS                             Anesthesia Physical Anesthesia Plan  ASA: 3  Anesthesia Plan: General   Post-op Pain Management: Minimal or no pain anticipated   Induction: Intravenous  PONV Risk Score and Plan: 3 and Ondansetron  and Treatment may vary due to age or medical condition  Airway Management Planned: Oral ETT  Additional Equipment: None  Intra-op Plan:   Post-operative Plan: Extubation in OR  Informed Consent: I have reviewed the patients History and Physical, chart, labs and discussed the procedure including the risks, benefits and alternatives for the proposed anesthesia with the patient or authorized representative who has indicated his/her understanding and acceptance.       Plan Discussed with: Anesthesiologist and CRNA  Anesthesia Plan Comments: (PAT note  by Rudy Costain, PA-C: 53 year old female follows with cardiology for history of paroxysmal atrial fibrillation s/p ablation 2016 not on anticoagulation due to CHA2DS2-VASc of 1.  Exercise tolerance test 12/2014 was normal.  TEE 03/2015 showed EF 60 to 65%, normal valves.  Previously had loop recorder that was explanted but battery died.  Last seen by Dr. Alois Arnt on 09/29/2023 and reported palpitations.  7-day Zio patch was ordered which showed underlying rhythm to be sinus, 7 runs of SVT, no atrial fibrillation.  She was recommended to continue as needed beta-blocker for symptomatic palpitations.  Other pertinent history includes OCD, anxiety/depression, glossopharyngeal neuralgia, essential tremor, vertigo, migraines, reported wakes up early from anesthesia.  Patient will need day of surgery labs and evaluation.  EKG 07/03/2023: Sinus rhythm.  Rate 84. RSR' in V1 or V2, probably normal variant  TEE 03/25/15: Study Conclusions  - Left ventricle: The cavity size was normal. Wall thickness was  normal. Systolic function was normal. The estimated ejection  fraction was in the range of 60% to 65%.  - Aortic valve: No evidence of vegetation.  - Mitral valve: No evidence of vegetation.  - Left atrium: No evidence of thrombus in the atrial cavity or  appendage.  - Atrial septum: No defect or patent foramen ovale was identified.    ETT 01/03/15:  There was no ST segment deviation noted during stress.  Good exercise capacity. No chest pain. Normal BP response to exercise. No ST changes to suggest ischemia.  No exercise induced arrhythmias.  )        Anesthesia Quick Evaluation

## 2024-02-09 NOTE — Progress Notes (Signed)
 Anesthesia Chart Review: Same day workup  53 year old female follows with cardiology for history of paroxysmal atrial fibrillation s/p ablation 2016 not on anticoagulation due to CHA2DS2-VASc of 1.  Exercise tolerance test 12/2014 was normal.  TEE 03/2015 showed EF 60 to 65%, normal valves.  Previously had loop recorder that was explanted but battery died.  Last seen by Dr. Alois Arnt on 09/29/2023 and reported palpitations.  7-day Zio patch was ordered which showed underlying rhythm to be sinus, 7 runs of SVT, no atrial fibrillation.  She was recommended to continue as needed beta-blocker for symptomatic palpitations.  Other pertinent history includes OCD, anxiety/depression, glossopharyngeal neuralgia, essential tremor, vertigo, migraines, reported wakes up early from anesthesia.  Patient will need day of surgery labs and evaluation.  EKG 07/03/2023: Sinus rhythm.  Rate 84. RSR' in V1 or V2, probably normal variant  TEE 03/25/15: Study Conclusions  - Left ventricle: The cavity size was normal. Wall thickness was    normal. Systolic function was normal. The estimated ejection    fraction was in the range of 60% to 65%.  - Aortic valve: No evidence of vegetation.  - Mitral valve: No evidence of vegetation.  - Left atrium: No evidence of thrombus in the atrial cavity or    appendage.  - Atrial septum: No defect or patent foramen ovale was identified.      ETT 01/03/15: There was no ST segment deviation noted during stress.   Good exercise capacity. No chest pain. Normal BP response to exercise. No ST changes to suggest ischemia.   No exercise induced arrhythmias.    Edilia Gordon [facility]/Anesthesiology Phone [phone] 02/09/2024 10:49 AM

## 2024-02-10 ENCOUNTER — Encounter (HOSPITAL_COMMUNITY): Payer: Self-pay | Admitting: Neurology

## 2024-02-10 ENCOUNTER — Ambulatory Visit (HOSPITAL_COMMUNITY)
Admission: RE | Admit: 2024-02-10 | Discharge: 2024-02-10 | Disposition: A | Discharge status: Home / Self Care | Attending: Neurology | Admitting: Neurology

## 2024-02-10 ENCOUNTER — Ambulatory Visit (HOSPITAL_COMMUNITY)
Admission: RE | Admit: 2024-02-10 | Discharge: 2024-02-10 | Disposition: A | Discharge status: Home / Self Care | Source: Ambulatory Visit | Attending: Neurology | Admitting: Neurology

## 2024-02-10 ENCOUNTER — Ambulatory Visit (HOSPITAL_COMMUNITY)

## 2024-02-10 ENCOUNTER — Ambulatory Visit (HOSPITAL_COMMUNITY): Payer: Self-pay | Admitting: Physician Assistant

## 2024-02-10 ENCOUNTER — Other Ambulatory Visit: Payer: Self-pay

## 2024-02-10 ENCOUNTER — Encounter (HOSPITAL_COMMUNITY)
Admission: RE | Disposition: A | Discharge status: Home / Self Care | Payer: Self-pay | Source: Home / Self Care | Attending: Neurology

## 2024-02-10 ENCOUNTER — Telehealth: Payer: Self-pay | Admitting: Neurology

## 2024-02-10 DIAGNOSIS — R42 Dizziness and giddiness: Secondary | ICD-10-CM | POA: Diagnosis not present

## 2024-02-10 DIAGNOSIS — M2578 Osteophyte, vertebrae: Secondary | ICD-10-CM | POA: Diagnosis not present

## 2024-02-10 DIAGNOSIS — M50221 Other cervical disc displacement at C4-C5 level: Secondary | ICD-10-CM | POA: Diagnosis not present

## 2024-02-10 DIAGNOSIS — G4733 Obstructive sleep apnea (adult) (pediatric): Secondary | ICD-10-CM | POA: Diagnosis not present

## 2024-02-10 DIAGNOSIS — M4802 Spinal stenosis, cervical region: Secondary | ICD-10-CM | POA: Insufficient documentation

## 2024-02-10 DIAGNOSIS — M542 Cervicalgia: Secondary | ICD-10-CM

## 2024-02-10 DIAGNOSIS — M50223 Other cervical disc displacement at C6-C7 level: Secondary | ICD-10-CM | POA: Diagnosis not present

## 2024-02-10 DIAGNOSIS — Z85820 Personal history of malignant melanoma of skin: Secondary | ICD-10-CM | POA: Diagnosis not present

## 2024-02-10 DIAGNOSIS — H8110 Benign paroxysmal vertigo, unspecified ear: Secondary | ICD-10-CM

## 2024-02-10 DIAGNOSIS — I4891 Unspecified atrial fibrillation: Secondary | ICD-10-CM | POA: Diagnosis not present

## 2024-02-10 HISTORY — PX: RADIOLOGY WITH ANESTHESIA: SHX6223

## 2024-02-10 LAB — BASIC METABOLIC PANEL WITH GFR
Anion gap: 9 (ref 5–15)
BUN: 12 mg/dL (ref 6–20)
CO2: 26 mmol/L (ref 22–32)
Calcium: 9 mg/dL (ref 8.9–10.3)
Chloride: 102 mmol/L (ref 98–111)
Creatinine, Ser: 0.82 mg/dL (ref 0.44–1.00)
GFR, Estimated: >60 mL/min (ref >60)
Glucose, Bld: 94 mg/dL (ref 70–99)
Potassium: 3.5 mmol/L (ref 3.5–5.1)
Sodium: 137 mmol/L (ref 135–145)

## 2024-02-10 LAB — POCT PREGNANCY, URINE: Preg Test, Ur: NEGATIVE

## 2024-02-10 SURGERY — MRI WITH ANESTHESIA
Anesthesia: General

## 2024-02-10 MED ORDER — OXYCODONE HCL 5 MG PO TABS: 5.0000 mg | ORAL_TABLET | Freq: Once | ORAL | Status: DC | PRN

## 2024-02-10 MED ORDER — OXYCODONE HCL 5 MG/5ML PO SOLN
5.0000 mg | Freq: Once | ORAL | Status: DC | PRN
Start: 2024-02-10 — End: 2024-02-10

## 2024-02-10 MED ORDER — PHENYLEPHRINE 80 MCG/ML (10ML) SYRINGE FOR IV PUSH (FOR BLOOD PRESSURE SUPPORT)
PREFILLED_SYRINGE | INTRAVENOUS | Status: DC | PRN
  Administered 2024-02-10: 80 ug via INTRAVENOUS

## 2024-02-10 MED ORDER — FENTANYL CITRATE (PF) 100 MCG/2ML IJ SOLN: 25.0000 ug | INTRAMUSCULAR | Status: DC | PRN

## 2024-02-10 MED ORDER — PROPOFOL 10 MG/ML IV BOLUS
INTRAVENOUS | Status: DC | PRN
  Administered 2024-02-10: 150 mg via INTRAVENOUS

## 2024-02-10 MED ORDER — MIDAZOLAM HCL 2 MG/2ML IJ SOLN
INTRAMUSCULAR | Status: DC | PRN
  Administered 2024-02-10 (×2): 2 mg via INTRAVENOUS

## 2024-02-10 MED ORDER — ORAL CARE MOUTH RINSE: 15.0000 mL | Freq: Once | OROMUCOSAL | Status: AC

## 2024-02-10 MED ORDER — SUGAMMADEX SODIUM 200 MG/2ML IV SOLN
INTRAVENOUS | Status: DC | PRN
  Administered 2024-02-10: 126.2 mg via INTRAVENOUS

## 2024-02-10 MED ORDER — SUCCINYLCHOLINE CHLORIDE 200 MG/10ML IV SOSY
PREFILLED_SYRINGE | INTRAVENOUS | Status: DC | PRN
  Administered 2024-02-10: 120 mg via INTRAVENOUS

## 2024-02-10 MED ORDER — MIDAZOLAM HCL 2 MG/2ML IJ SOLN
INTRAMUSCULAR | Status: AC
  Filled 2024-02-10: qty 2

## 2024-02-10 MED ORDER — FENTANYL CITRATE (PF) 250 MCG/5ML IJ SOLN
INTRAMUSCULAR | Status: DC | PRN
  Administered 2024-02-10: 100 ug via INTRAVENOUS

## 2024-02-10 MED ORDER — ROCURONIUM BROMIDE 10 MG/ML (PF) SYRINGE
PREFILLED_SYRINGE | INTRAVENOUS | Status: DC | PRN
  Administered 2024-02-10: 30 mg via INTRAVENOUS

## 2024-02-10 MED ORDER — LIDOCAINE 2% (20 MG/ML) 5 ML SYRINGE
INTRAMUSCULAR | Status: DC | PRN
  Administered 2024-02-10: 50 mg via INTRAVENOUS

## 2024-02-10 MED ORDER — GADOBUTROL 1 MMOL/ML IV SOLN
6.0000 mL | Freq: Once | INTRAVENOUS | Status: AC | PRN
  Administered 2024-02-10: 6 mL via INTRAVENOUS

## 2024-02-10 MED ORDER — DEXAMETHASONE SODIUM PHOSPHATE 10 MG/ML IJ SOLN
INTRAMUSCULAR | Status: DC | PRN
  Administered 2024-02-10: 10 mg via INTRAVENOUS

## 2024-02-10 MED ORDER — LACTATED RINGERS IV SOLN: INTRAVENOUS | Status: DC

## 2024-02-10 MED ORDER — ONDANSETRON HCL 4 MG/2ML IJ SOLN
INTRAMUSCULAR | Status: DC | PRN
  Administered 2024-02-10: 4 mg via INTRAVENOUS

## 2024-02-10 MED ORDER — CHLORHEXIDINE GLUCONATE 0.12 % MT SOLN
15.0000 mL | Freq: Once | OROMUCOSAL | Status: AC
  Administered 2024-02-10: 15 mL via OROMUCOSAL
  Filled 2024-02-10: qty 15

## 2024-02-10 MED ORDER — MEPERIDINE HCL 25 MG/ML IJ SOLN: 6.2500 mg | INTRAMUSCULAR | Status: DC | PRN

## 2024-02-10 MED ORDER — ONDANSETRON HCL 4 MG/2ML IJ SOLN: 4.0000 mg | Freq: Once | INTRAMUSCULAR | Status: DC | PRN

## 2024-02-10 MED ORDER — FENTANYL CITRATE (PF) 100 MCG/2ML IJ SOLN
INTRAMUSCULAR | Status: AC
  Filled 2024-02-10: qty 2

## 2024-02-10 NOTE — Telephone Encounter (Signed)
 Dr. Del Favia would like to discuss MRI with Dr. Festus Hubert as [facility] called, please call asap direct to Dr.Huntoon

## 2024-02-10 NOTE — Telephone Encounter (Signed)
 MRI Cervical Spine needs to be changed to With and Without.   Order changed.

## 2024-02-10 NOTE — Transfer of Care (Signed)
 Immediate Anesthesia Transfer of Care Note  Patient: [name]  Procedure(s) Performed: MRI WITH ANESTHESIA  Patient Location: PACU  Anesthesia Type:General  Level of Consciousness: awake, alert , and oriented  Airway & Oxygen Therapy: Patient Spontanous Breathing  Post-op Assessment: Report given to RN and Post -op Vital signs reviewed and stable  Post vital signs: Reviewed and stable  Last Vitals:  Vitals Value Taken Time  BP 104/80 02/10/24 09:38  Temp 37.1 C 02/10/24 09:38  Pulse 106 02/10/24 09:41  Resp 16 02/10/24 09:39  SpO2 100 % 02/10/24 09:41  Vitals shown include unfiled device data.  Last Pain:  Vitals:   02/10/24 0930  TempSrc:   PainSc: 0-No pain         Complications: No notable events documented.

## 2024-02-10 NOTE — Anesthesia Procedure Notes (Signed)
 Procedure Name: Intubation Date/Time: 02/10/2024 7:22 AM  Performed by: Robert Chimes, CRNAPre-anesthesia Checklist: Patient identified, Emergency Drugs available, Suction available and Patient being monitored Patient Re-evaluated:Patient Re-evaluated prior to induction Oxygen Delivery Method: Circle system utilized Preoxygenation: Pre-oxygenation with 100% oxygen Induction Type: IV induction and Cricoid Pressure applied Laryngoscope Size: Mac and 3 Grade View: Grade II Tube type: Oral Tube size: 7.0 mm Number of attempts: 1 Airway Equipment and Method: Stylet and Oral airway Placement Confirmation: ETT inserted through vocal cords under direct vision, positive ETCO2 and breath sounds checked- equal and bilateral Secured at: 21 cm Tube secured with: Tape Dental Injury: Teeth and Oropharynx as per pre-operative assessment

## 2024-02-10 NOTE — Anesthesia Postprocedure Evaluation (Signed)
 Anesthesia Post Note  Patient: [name]  Procedure(s) Performed: MRI WITH ANESTHESIA     Patient location during evaluation: PACU Anesthesia Type: General Level of consciousness: awake and alert Pain management: pain level controlled Vital Signs Assessment: post-procedure vital signs reviewed and stable Respiratory status: spontaneous breathing, nonlabored ventilation, respiratory function stable and patient connected to nasal cannula oxygen Cardiovascular status: blood pressure returned to baseline and stable Postop Assessment: no apparent nausea or vomiting Anesthetic complications: no   No notable events documented.  Last Vitals:  Vitals:   02/10/24 0930 02/10/24 0938  BP: 110/75 104/80  Pulse: (!) 114 (!) 110  Resp: 15 11  Temp:  37.1 C  SpO2: 100% 100%    Last Pain:  Vitals:   02/10/24 0930  TempSrc:   PainSc: 0-No pain                 Isaish Alemu

## 2024-02-11 ENCOUNTER — Ambulatory Visit: Payer: Self-pay | Admitting: Neurology

## 2024-02-11 ENCOUNTER — Encounter (HOSPITAL_COMMUNITY): Payer: Self-pay | Admitting: Radiology

## 2024-02-11 NOTE — H&P (Signed)
 See H & P form and office note from 02/04/2024 for history of present illness and physical exam

## 2024-02-11 NOTE — Progress Notes (Signed)
 LMOVM to call the office [place].

## 2024-02-15 DIAGNOSIS — F411 Generalized anxiety disorder: Secondary | ICD-10-CM | POA: Diagnosis not present

## 2024-02-15 DIAGNOSIS — F422 Mixed obsessional thoughts and acts: Secondary | ICD-10-CM | POA: Diagnosis not present

## 2024-02-15 DIAGNOSIS — F331 Major depressive disorder, recurrent, moderate: Secondary | ICD-10-CM | POA: Diagnosis not present

## 2024-02-22 ENCOUNTER — Other Ambulatory Visit: Payer: Self-pay | Admitting: Neurology

## 2024-03-22 ENCOUNTER — Other Ambulatory Visit

## 2024-03-22 DIAGNOSIS — Z006 Encounter for examination for normal comparison and control in clinical research program: Secondary | ICD-10-CM

## 2024-04-01 LAB — GENECONNECT MOLECULAR SCREEN: Genetic Analysis Overall Interpretation: NEGATIVE

## 2024-04-13 DIAGNOSIS — F411 Generalized anxiety disorder: Secondary | ICD-10-CM | POA: Diagnosis not present

## 2024-04-13 DIAGNOSIS — F422 Mixed obsessional thoughts and acts: Secondary | ICD-10-CM | POA: Diagnosis not present

## 2024-04-13 DIAGNOSIS — F331 Major depressive disorder, recurrent, moderate: Secondary | ICD-10-CM | POA: Diagnosis not present

## 2024-05-16 NOTE — Progress Notes (Unsigned)
 NEUROLOGY FOLLOW UP OFFICE NOTE  [name] [id number]  Assessment/Plan:   Vertigo.  History and exam seems consistent with benign paroxysmal positional vertigo, however she has not responded to Epley maneuver.  Will check for secondary intracranial or internal auditory etiologies.  Also, with right sided neck pain, consider cervicogenic etiology.   Migraine without aura, without status migrainosus, not intractable Essential tremor Glossopharyngeal neuralgia   Vertigo:  *** Migraine prevention:  Aimovig  140mg  every 28 days.  Migraine rescue:  Sumatriptan  4mg  [place], Zofran  ODT 8mg   For tremor: propranolol  ER 60mg  daily.   For glossopharyngeal neuralgia flares: Gabapentin  200mg  to take as needed.  If it is frequent, may need to take daily and not as needed. Limit use of pain relievers to no more than 9 days out of the month to prevent risk of rebound or medication-overuse headache. Keep headache diary Follow up 6 months.  Total time spent in chart and face to face with patient:  20 minutes   Subjective:  [name] is a 53 year old right-handed female with paroxysmal atrial fibrillation with RVR s/p ablation, SVT, and history of melanoma, who follows up for vertigo.  She is accompanied by her husband who supplements history.   UPDATE: Vertigo: Underwent imaging on 02/10/2024 to evaluate for secondary MRI BRAIN & IAC W WO:  Normal and stable MRI of the brain and IACs without and with contrast. MRI C-SPINE W WO:  1. Mild straightening of the normal cervical lordosis. 2. No focal cord signal abnormality or mass  lesion to explain dizziness 3. Leftward disc osteophyte  complex at C3-4 with mild central canal stenosis and moderate left foraminal stenosis. 4. Broad-based disk osteophyte complex at C4-5 with moderate central canal stenosis and moderate foraminal narrowing bilaterally, worse on the right. 5. Broad-based disc osteophyte complex at C5-6 with moderate right and mild left foraminal  stenosis. 6. Asymmetric left-sided soft disc protrusion at C6-7 with moderate to severe left foraminal stenosis.  Recommended PT for the neck.  ***  Migraines: Migraine frequency fairly stable but acute treatments not effective (rizatriptan , Advil , Benadryl ), especially since she primarily wakes up with the migraine. Intensity:  moderate Duration:  20 minutes with sumatriptan  [place] Frequency:  Usually 2 a month.  6 migraines in last month due to the weather (4 were just visual aura - floaters - and 2 with pain)   Essential tremor is stable on propranolol   Stopped gabapentin  because it didn't help.  But hasn't had an episode in 6 weeks.   Current NSAIDS/analgesics:  Tylenol , Advil  Current triptans:  sumatriptan  4mg   Current ergotamine:  none Current anti-emetic:  Zofran  ODT 8mg  Current muscle relaxants:  none Current Antihypertensive medications:  propranolol  ER 60mg  daily, nadolol  PRN (rarely takes) Current Antidepressant medications:  Venlafaxine XR 75mg  daily Current Anticonvulsant medications:  gabapentin  100mg  PRN (glossopharyngeal neuralgia, has not needed) Current anti-CGRP:  Aimovig  140mg  Current Vitamins/Herbal/Supplements:  MVI Current Antihistamines/Decongestants:  none Other therapy:  Ice, compression Hormone/birth control:  none Other medications:  Dexmethylphenidate, Seroquel  100mg  QHS PRN   Caffeine:  2 cups coffee in AM, 1 diet Coke sometimes Diet:  Poor water intake.  Only skips meals if wakes up nauseous Exercise:  routine Depression:  controlled; Anxiety: controlled Other pain:  no Sleep:  Varies.  Uses Seroquel  as needed.   HISTORY:  Migraines: She has had migraines since age 40.  They are typically severe right-sided pounding headache usually preceded by blurred vision/enlarged blind spot in right eye and  neck pain.  Associated nausea, vomiting, photophobia and phonophobia.  They occurred on and off until college and then they have occurred infrequently, but  when they would occur, she often would require an ED visit.  About 2 to 3 years ago, she began having new headaches which have progressively become more frequent.  They are moderate pulsating pain usually in right temple but sometimes behind both eyes or back of head and neck.  No preceding aura.  Associated nausea, photophobia and phonophobia.  Often will wake up with them and will last until the next day.  Afterwards, her head feels sore.  Change in barometric pressure is a common trigger.  Bending over aggravates the headache.  She is unable to eat or exercise.  Cold compresses or other pressure helps relieve the pain.  Usually occurs a day prior to start of her menstrual cycle but on average occurs 3 to 4 days a month.     On 05/13/2017, she was seen in the ED for an intractable migraine.  MRI and MRA of brain without contrast were normal.  MRI of brain with and without contrast (performed under sedation) on 09/10/2020 showed few scattered punctate T2/FLAIR foci within the cerebral white matter  Vertigo: She has been feeling dizzy, a spinning sensation, beginning in March 2025.  Lasts a minute.  Positional.  Occurs mostly when laying down or turning in bed.  When she is upright, she feels a little more lightheaded.  She saw ENT in May 2025.  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  She was instructed on performing home Epley maneuver and referred to physical therapy.  However, she has not had relief.  She continues to have onset of vertigo with brief movements, such as when she looks up in the shower to wash her hair or if she quickly turns to talk to somebody.  She also has had increased right sided neck pain with pain radiating up the back of her head.  She is more cautious when she walks.  Denies diplopia, tinnitus or hearing loss.    Essential tremor: Essential tremor runs in her family.  Both her mom and sister have it.  It comes and goes, often more noticeable later in day or evening.  Her hand will  shake with use.  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     Glossopharyngeal Neuralgia: Beginning in November 2022, she started experiencing recurrent episodes of severe sharp deep pain in her left ear radiating to the back of her throat and posterior tongue with associated numbness.  No congestion or change in hearing.  She thought that it could have been a nerve problem, possibly zoster, but she never developed a rash.  It lasted about a week and then resolved.  Since then, she has had recurrent episodes usually lasting about a day and occurring approximately every 4 months.    Pain: Around the same time, she developed right elbow pain with pain and paresthesias radiating over her forearm to the last two digits of her right hand.  Her elbow is quite tender to touch.  She also endorses weakness with difficulty opening jars.  She has right sided neck pain that isn't new.  She reports history of possible Raynaud's involving the fifth digit.     In January 2023, she started developing pain in the anterior right thigh down to the knee.  It is a burning pain.  Thigh sore to touch.  It  is not aggravated by bearing weight, only if touching her thigh.  Denies back pain, numbness, tingling, leg weakness or bowel/bladder dysfunction.  She notes that she had a melanoma removed on her right arm just above the elbow as well as above the right knee.  NCV-EMG of right upper extremity on 10/14/2021 was normal.  The symptoms resolved.  Still mild discomfort only in right middle finger which may be arthritis.  Right lower extremity was deferred as symptoms resolved.        Past NSAIDS/analgesics:  Midrin, Elyxyb Past abortive triptans:  sumatriptan  tablet, rizatriptan -MLT 10mg  Past abortive ergotamine:  none Past muscle relaxants:  none Past anti-emetic:  Zofran  Past antihypertensive medications:  Propranolol   Past antidepressant medications:  Amitriptyline, fluoxetine,  Wellbutrin  Past anticonvulsant medications:  none Past anti-CGRP:  Nurtec, Qulipta , Ubrelvy  Past vitamins/Herbal/Supplements:  none Past antihistamines/decongestants:  cyproheptadine  Other past therapies:  none      Family history of headache:  Sister (migraines) No family history of aneurysm.    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 1 mL 11   Multiple Vitamins-Minerals (MULTIVITAMIN WITH MINERALS) tablet Take 1 tablet by mouth daily.     nadolol  (CORGARD ) 20 MG tablet TAKE 1/2 TABLET BY MOUTH DAILY (Patient taking differently: Take 20 mg by mouth daily as needed (Heart).) 45 tablet 3   ondansetron  (ZOFRAN -ODT) 8 MG disintegrating tablet TAKE 1 TABLET BY MOUTH EVERY 8 HOURS AS NEEDED 20 tablet 5   propranolol  ER (INDERAL  LA) 60 MG 24 hr capsule TAKE 1 CAPSULE BY MOUTH EVERY DAY 90 capsule 1   QUEtiapine  (SEROQUEL ) 25 MG tablet Take 25 mg by mouth at bedtime as needed for sleep.     SUMAtriptan  (IMITREX ) 6 MG/0.5ML SOLN injection Inject 0.5 mLs (6 mg total) into the skin as needed for migraine or headache. May repeat in 1 hour if headache persists or recurs.  Maximum 2 injections in 24 hours. 3 mL 5   venlafaxine XR (EFFEXOR-XR) 75 MG 24 hr capsule Take 225 mg by mouth daily.  0   No current facility-administered medications on file prior to visit.     ALLERGIES: No  Known Allergies  FAMILY HISTORY: Family History  Problem Relation Age of Onset   COPD Mother    Heart attack Father       Objective:  *** General: No acute distress.  Patient appears well-groomed.   Head:  Normocephalic/atraumatic Neck:  Supple.  Right paraspinal tenderness.  Full range of motion. Heart:  Regular rate and rhythm. Lungs:  Clear to auscultation bilaterally Neuro:  Alert and oriented.  Speech fluent and not dysarthric.  Language intact.  CN II-XII intact.  Positive Head Impulse Test right.  Bulk and tone normal.  Muscle strength 5/5 throughout.  Sensation to light touch intact.  Deep tendon reflexes 2+ throughout, toes downgoing.  Gait normal.  Able to turn.  Slowed tandem walk.  Romberg negative.      Juliene Dunnings, DO

## 2024-05-17 ENCOUNTER — Ambulatory Visit (INDEPENDENT_AMBULATORY_CARE_PROVIDER_SITE_OTHER): Admitting: Neurology

## 2024-05-17 ENCOUNTER — Encounter: Payer: Self-pay | Admitting: Neurology

## 2024-05-17 VITALS — BP 107/68 | HR 127 | Ht 65.0 in | Wt 142.6 lb

## 2024-05-17 DIAGNOSIS — G43009 Migraine without aura, not intractable, without status migrainosus: Secondary | ICD-10-CM | POA: Diagnosis not present

## 2024-05-17 DIAGNOSIS — R42 Dizziness and giddiness: Secondary | ICD-10-CM

## 2024-05-17 DIAGNOSIS — M542 Cervicalgia: Secondary | ICD-10-CM

## 2024-05-17 DIAGNOSIS — G250 Essential tremor: Secondary | ICD-10-CM

## 2024-05-17 MED ORDER — SUMATRIPTAN SUCCINATE 6 MG/0.5ML ~~LOC~~ SOLN: 6.0000 mg | SUBCUTANEOUS | 5 refills | Status: AC | PRN

## 2024-05-17 MED ORDER — ONDANSETRON 8 MG PO TBDP: 8.0000 mg | ORAL_TABLET | Freq: Three times a day (TID) | ORAL | 5 refills | Status: AC | PRN

## 2024-05-17 NOTE — Patient Instructions (Addendum)
 Refer to physical therapy for neck pain Aimovig  140mg  every 28 days Propranolol  ER 60mg  daily (try taking at bedtime) Sumatriptan  4mg  injection and Zofran  as needed

## 2024-05-18 ENCOUNTER — Other Ambulatory Visit (HOSPITAL_COMMUNITY)

## 2024-06-12 ENCOUNTER — Ambulatory Visit: Admitting: Neurology

## 2024-06-13 ENCOUNTER — Other Ambulatory Visit (HOSPITAL_COMMUNITY): Payer: Self-pay

## 2024-06-13 ENCOUNTER — Telehealth: Payer: Self-pay | Admitting: Pharmacy Technician

## 2024-06-13 ENCOUNTER — Encounter: Payer: Self-pay | Admitting: Neurology

## 2024-06-13 NOTE — Telephone Encounter (Signed)
 PA has been submitted, and telephone encounter has been created. Please see telephone encounter dated 10.21.25.

## 2024-06-13 NOTE — Telephone Encounter (Signed)
 Pharmacy Patient Advocate Encounter   Received notification from Pt Calls Messages that prior authorization for AIMOVIG  140MG  is required/requested.   Insurance verification completed.   The patient is insured through [facility].   Per test claim: PA required; PA submitted to above mentioned insurance via Latent Key/confirmation #/EOC [id number] Status is pending

## 2024-06-14 ENCOUNTER — Other Ambulatory Visit (HOSPITAL_COMMUNITY): Payer: Self-pay

## 2024-06-14 NOTE — Telephone Encounter (Signed)
 Pharmacy Patient Advocate Encounter  Received notification from [employer] that Prior Authorization for AIMOVIG  140MG  has been APPROVED from 10.21.25 to 10.21.27. Ran test claim, Copay is $35. This test claim was processed through [facility]- copay amounts may vary at other pharmacies due to pharmacy/plan contracts, or as the patient moves through the different stages of their insurance plan.   PA #/Case ID/Reference #: [id number]

## 2024-06-20 DIAGNOSIS — F331 Major depressive disorder, recurrent, moderate: Secondary | ICD-10-CM | POA: Diagnosis not present

## 2024-06-20 DIAGNOSIS — F422 Mixed obsessional thoughts and acts: Secondary | ICD-10-CM | POA: Diagnosis not present

## 2024-06-20 DIAGNOSIS — F411 Generalized anxiety disorder: Secondary | ICD-10-CM | POA: Diagnosis not present

## 2024-07-17 DIAGNOSIS — Z131 Encounter for screening for diabetes mellitus: Secondary | ICD-10-CM | POA: Diagnosis not present

## 2024-07-17 DIAGNOSIS — Z1329 Encounter for screening for other suspected endocrine disorder: Secondary | ICD-10-CM | POA: Diagnosis not present

## 2024-07-17 DIAGNOSIS — Z0000 Encounter for general adult medical examination without abnormal findings: Secondary | ICD-10-CM | POA: Diagnosis not present

## 2024-07-17 DIAGNOSIS — Z01411 Encounter for gynecological examination (general) (routine) with abnormal findings: Secondary | ICD-10-CM | POA: Diagnosis not present

## 2024-07-17 DIAGNOSIS — Z1331 Encounter for screening for depression: Secondary | ICD-10-CM | POA: Diagnosis not present

## 2024-07-17 DIAGNOSIS — Z13220 Encounter for screening for lipoid disorders: Secondary | ICD-10-CM | POA: Diagnosis not present

## 2024-08-02 DIAGNOSIS — D225 Melanocytic nevi of trunk: Secondary | ICD-10-CM | POA: Diagnosis not present

## 2024-08-02 DIAGNOSIS — L410 Pityriasis lichenoides et varioliformis acuta: Secondary | ICD-10-CM | POA: Diagnosis not present

## 2024-08-02 DIAGNOSIS — Z85820 Personal history of malignant melanoma of skin: Secondary | ICD-10-CM | POA: Diagnosis not present

## 2024-08-02 DIAGNOSIS — L821 Other seborrheic keratosis: Secondary | ICD-10-CM | POA: Diagnosis not present

## 2024-08-14 DIAGNOSIS — F411 Generalized anxiety disorder: Secondary | ICD-10-CM | POA: Diagnosis not present

## 2024-08-14 DIAGNOSIS — F422 Mixed obsessional thoughts and acts: Secondary | ICD-10-CM | POA: Diagnosis not present

## 2024-08-14 DIAGNOSIS — F331 Major depressive disorder, recurrent, moderate: Secondary | ICD-10-CM | POA: Diagnosis not present

## 2024-12-05 ENCOUNTER — Ambulatory Visit: Admitting: Neurology
